# Patient Record
Sex: Female | Born: 1950 | Race: Black or African American | Hispanic: No | Marital: Single | Smoking: Never smoker
Health system: Southern US, Community
[De-identification: ages and names within clinical notes are randomized; demographics above are authoritative.]

## PROBLEM LIST (undated history)

## (undated) DIAGNOSIS — E119 Type 2 diabetes mellitus without complications: Secondary | ICD-10-CM

## (undated) DIAGNOSIS — D509 Iron deficiency anemia, unspecified: Secondary | ICD-10-CM

## (undated) HISTORY — PX: ABDOMINAL HYSTERECTOMY: SHX81

---

## 2014-01-31 DIAGNOSIS — I5030 Unspecified diastolic (congestive) heart failure: Secondary | ICD-10-CM

## 2014-01-31 HISTORY — DX: Unspecified diastolic (congestive) heart failure: I50.30

## 2014-11-11 ENCOUNTER — Encounter (HOSPITAL_COMMUNITY): Payer: Self-pay | Admitting: Emergency Medicine

## 2014-11-11 ENCOUNTER — Emergency Department (HOSPITAL_COMMUNITY): Payer: Self-pay

## 2014-11-11 ENCOUNTER — Observation Stay (HOSPITAL_COMMUNITY)
Admission: EM | Admit: 2014-11-11 | Discharge: 2014-11-12 | Disposition: A | Discharge status: Home / Self Care | Payer: Self-pay | Attending: Internal Medicine | Admitting: Internal Medicine

## 2014-11-11 DIAGNOSIS — R9431 Abnormal electrocardiogram [ECG] [EKG]: Secondary | ICD-10-CM | POA: Insufficient documentation

## 2014-11-11 DIAGNOSIS — N179 Acute kidney failure, unspecified: Secondary | ICD-10-CM | POA: Insufficient documentation

## 2014-11-11 DIAGNOSIS — R7989 Other specified abnormal findings of blood chemistry: Secondary | ICD-10-CM

## 2014-11-11 DIAGNOSIS — Y9389 Activity, other specified: Secondary | ICD-10-CM | POA: Insufficient documentation

## 2014-11-11 DIAGNOSIS — Z90710 Acquired absence of both cervix and uterus: Secondary | ICD-10-CM | POA: Insufficient documentation

## 2014-11-11 DIAGNOSIS — Y9289 Other specified places as the place of occurrence of the external cause: Secondary | ICD-10-CM | POA: Insufficient documentation

## 2014-11-11 DIAGNOSIS — N189 Chronic kidney disease, unspecified: Secondary | ICD-10-CM

## 2014-11-11 DIAGNOSIS — D649 Anemia, unspecified: Secondary | ICD-10-CM

## 2014-11-11 DIAGNOSIS — R739 Hyperglycemia, unspecified: Secondary | ICD-10-CM | POA: Insufficient documentation

## 2014-11-11 DIAGNOSIS — Y998 Other external cause status: Secondary | ICD-10-CM | POA: Insufficient documentation

## 2014-11-11 DIAGNOSIS — R55 Syncope and collapse: Principal | ICD-10-CM | POA: Insufficient documentation

## 2014-11-11 DIAGNOSIS — W19XXXA Unspecified fall, initial encounter: Secondary | ICD-10-CM | POA: Insufficient documentation

## 2014-11-11 DIAGNOSIS — M25561 Pain in right knee: Secondary | ICD-10-CM | POA: Insufficient documentation

## 2014-11-11 LAB — BASIC METABOLIC PANEL
Anion gap: 9 (ref 5–15)
BUN: 20 mg/dL (ref 6–20)
CHLORIDE: 106 mmol/L (ref 101–111)
CO2: 23 mmol/L (ref 22–32)
Calcium: 9.4 mg/dL (ref 8.9–10.3)
Creatinine, Ser: 1.08 mg/dL — ABNORMAL HIGH (ref 0.44–1.00)
GFR calc Af Amer: >60 mL/min (ref >60)
GFR calc non Af Amer: 53 mL/min — ABNORMAL LOW (ref >60)
Glucose, Bld: 286 mg/dL — ABNORMAL HIGH (ref 65–99)
POTASSIUM: 4.2 mmol/L (ref 3.5–5.1)
SODIUM: 138 mmol/L (ref 135–145)

## 2014-11-11 LAB — I-STAT TROPONIN, ED: Troponin i, poc: 0.01 ng/mL (ref 0.00–0.08)

## 2014-11-11 LAB — CBC
HEMATOCRIT: 34.7 % — AB (ref 36.0–46.0)
Hemoglobin: 11.7 g/dL — ABNORMAL LOW (ref 12.0–15.0)
MCH: 28.8 pg (ref 26.0–34.0)
MCHC: 33.7 g/dL (ref 30.0–36.0)
MCV: 85.5 fL (ref 78.0–100.0)
Platelets: 264 10*3/uL (ref 150–400)
RBC: 4.06 MIL/uL (ref 3.87–5.11)
RDW: 13.4 % (ref 11.5–15.5)
WBC: 7.9 10*3/uL (ref 4.0–10.5)

## 2014-11-11 LAB — MAGNESIUM: MAGNESIUM: 2.1 mg/dL (ref 1.7–2.4)

## 2014-11-11 MED ORDER — ACETAMINOPHEN 500 MG PO TABS
1000.0000 mg | ORAL_TABLET | Freq: Once | ORAL | Status: AC
  Administered 2014-11-11: 1000 mg via ORAL
  Filled 2014-11-11: qty 2

## 2014-11-11 MED ORDER — SODIUM CHLORIDE 0.9 % IV BOLUS (SEPSIS)
500.0000 mL | Freq: Once | INTRAVENOUS | Status: AC
  Administered 2014-11-11: 500 mL via INTRAVENOUS

## 2014-11-11 MED ORDER — BACITRACIN ZINC 500 UNIT/GM EX OINT
TOPICAL_OINTMENT | Freq: Once | CUTANEOUS | Status: AC
  Administered 2014-11-11: 1 via TOPICAL
  Filled 2014-11-11: qty 0.9

## 2014-11-11 NOTE — ED Notes (Signed)
Pt presents via EMS for syncopal episode. Denies head injury, no anticoagulants. A&O x4.   20g left wrist, 250cc NS in route.    Last VS:112/64, cbg WNL

## 2014-11-11 NOTE — ED Provider Notes (Signed)
CSN: [id number]     Arrival date & time 11/11/14  1845 History   First MD Initiated Contact with Patient 11/11/14 1917     Chief Complaint  Patient presents with  . Near Syncope     (Consider location/radiation/quality/duration/timing/severity/associated sxs/prior Treatment) HPI   Blood pressure 107/80, pulse 92, temperature 97.7 F (36.5 C), temperature source Oral, resp. rate 18, SpO2 100 %.  Ruth Reynolds is a 64 y.o. female complaining of syncopal episodes while waiting for the bus. Patient states she was transferring, standing up and she felt lightheaded, she fell on a bystander helped her up, she fell a second time. She does not believe there was any head trauma. Patient states that she scraped her knees in the process. She denies prodrome of chest pain, shortness of breath. Of note, Patient has been on the fast for the last several days and eating only apples and water with lemon, states that she's had a lot of water. Patient states that she had a good meal today however. Denies nausea, vomiting, melena, hematochezia, abdominal pain, chest pain, history of DVT or PE, calf pain, leg swelling, recent immobilizations.    No primary care  History reviewed. No pertinent past medical history. Past Surgical History  Procedure Laterality Date  . Abdominal hysterectomy     History reviewed. No pertinent family history. Social History  Substance Use Topics  . Smoking status: Never Smoker   . Smokeless tobacco: None  . Alcohol Use: Yes   OB History    No data available     Review of Systems  10 systems reviewed and found to be negative, except as noted in the HPI.   Allergies  Review of patient's allergies indicates no known allergies.  Home Medications   Prior to Admission medications   Medication Sig Start Date End Date Taking? Authorizing Provider  Ascorbic Acid (VITAMIN C) 100 MG tablet Take 100 mg by mouth daily.   Yes Historical Provider, MD  Multiple  Vitamins-Minerals (MULTIVITAMIN & MINERAL PO) Take 1 tablet by mouth daily.   Yes Historical Provider, MD  vitamin E 100 UNIT capsule Take 100 Units by mouth daily.   Yes Historical Provider, MD   BP 126/71 mmHg  Pulse 86  Temp(Src) 97.7 F (36.5 C) (Oral)  Resp 16  SpO2 100% Physical Exam  Constitutional: She is oriented to person, place, and time. She appears well-developed and well-nourished. No distress.  HENT:  Head: Normocephalic and atraumatic.  Mouth/Throat: Oropharynx is clear and moist.  Area of alopecia to the right temple which patient states she's had for one year.  No abrasions or contusions.   No hemotympanum, battle signs or raccoon's eyes  No crepitance or tenderness to palpation along the orbital rim.  EOMI intact with no pain or diplopia  No abnormal otorrhea or rhinorrhea. Nasal septum midline.  No intraoral trauma.      Eyes: Conjunctivae and EOM are normal. Pupils are equal, round, and reactive to light.  Neck: Normal range of motion.  No midline C-spine  tenderness to palpation or step-offs appreciated. Patient has full range of motion without pain.  Grip strength, biceps, triceps 5/5 bilaterally;  can differentiate between pinprick and light touch bilaterally.   Cardiovascular: Normal rate, regular rhythm and intact distal pulses.   Pulmonary/Chest: Effort normal and breath sounds normal. No stridor. No respiratory distress. She has no wheezes. She has no rales. She exhibits no tenderness.  Abdominal: Soft. Bowel sounds are normal. She exhibits no distension  and no mass. There is no tenderness. There is no rebound and no guarding.  Musculoskeletal: Normal range of motion. She exhibits tenderness.  Right knee:  Abrasion to right knee. No deformity FROM. No effusion or crepitance. Anterior and posterior drawer show no abnormal laxity. Stable to valgus and varus stress. Joint lines are non-tender. Neurovascularly intact.  Neurological: She is alert and  oriented to person, place, and time.  Skin: Skin is warm.  Psychiatric: She has a normal mood and affect.  Nursing note and vitals reviewed.   ED Course  Procedures (including critical care time) Labs Review Labs Reviewed  CBC - Abnormal; Notable for the following:    Hemoglobin 11.7 (*)    HCT 34.7 (*)    All other components within normal limits  BASIC METABOLIC PANEL - Abnormal; Notable for the following:    Glucose, Bld 286 (*)    Creatinine, Ser 1.08 (*)    GFR calc non Af Amer 53 (*)    All other components within normal limits  MAGNESIUM  URINALYSIS, ROUTINE W REFLEX MICROSCOPIC (NOT AT [facility])  Rosezena Sensor, ED    Imaging Review Dg Knee Complete 4 Views Right  11/11/2014   CLINICAL DATA:  64 year old female who fell onto right knee today with anterior pain. Initial encounter.  EXAM: RIGHT KNEE - COMPLETE 4+ VIEW  COMPARISON:  None.  FINDINGS: Patella appears intact. No joint effusion identified. Joint spaces preserved with mild tricompartmental degenerative spurring. No acute osseous abnormality identified.  IMPRESSION: No acute fracture or dislocation identified about the right knee.   Electronically Signed   By: Odessa Fleming M.D.   On: 11/11/2014 20:17   I have personally reviewed and evaluated these images and lab results as part of my medical decision-making.   EKG Interpretation   Date/Time:  Tuesday November 11 2014 19:52:52 EDT Ventricular Rate:  103 PR Interval:  180 QRS Duration: 95 QT Interval:  385 QTC Calculation: 504 R Axis:   -21 Text Interpretation:  Sinus tachycardia Borderline left axis deviation  Nonspecific T abnormalities, lateral leads Prolonged QT interval No prior  EKG for comparison Confirmed by LIU MD, DANA [phone]) on 11/11/2014 9:18:49  PM      MDM   Final diagnoses:  Hyperglycemia without ketosis  Syncope and collapse  Prolonged Q-T interval on ECG  Elevated serum creatinine    Filed Vitals:   11/11/14 1920 11/11/14 2119  BP:  107/80 126/71  Pulse: 92 86  Temp: 97.7 F (36.5 C)   TempSrc: Oral   Resp: 18 16  SpO2: 100% 100%    Medications  acetaminophen (TYLENOL) tablet 1,000 mg (1,000 mg Oral Given 11/11/14 2253)  bacitracin ointment (1 application Topical Given 11/11/14 2255)  sodium chloride 0.9 % bolus 500 mL (500 mLs Intravenous New Bag/Given 11/11/14 2255)    Flo Kayes is a pleasant 64 y.o. female presenting syncopal episodes while patient was waiting for a bus. Patient states that she has been fasting for the last several days, she has been pushing fluids however and had a normal meal today. EKG shows prolonged QT C of 504. Normal potassium and magnesium. Blood work shows hyperglycemia at 286 with acute kidney injury at 1.08. Borderline orthostatic vital signs. Considering this patient's syncopal event and prolonged QT will need observation admission. Case discussed with Dr. Park Breed who will put him holding orders. Small abrasion to right knee, x-ray negative. Patient declines tetanus shot.  This is a shared visit with the attending physician who  personally evaluated the patient and agrees with the care plan.      Wynetta Emery, PA-C 11/11/14 2305  Wynetta Emery, PA-C 11/11/14 1610  Lavera Guise, MD 11/12/14 1213

## 2014-11-11 NOTE — H&P (Signed)
[facility] History and Physical  Shaneka Beechy NWG:[id number] DOB: 1950/07/28 DOA: 11/11/2014  Referring physician: Wynetta Emery, PA-C PCP: No primary care provider on file.   Chief Complaint: Syncope  HPI: Ruth Reynolds is a 64 y.o. female with no significant history and no PCP presents with a syncopal episode. Patient was apparently waiting for the bus when she stood up she felt light headed and fell on a bystander. Patient tried to get up she again fell. She states she may have lost consciousness. She states that she did not hit anything. She states that she fell to her knees. She did not feel weak prior to falling down Patient did not have any chest pain noted. She denies having headache she did not have any shortness of breath. No nausea vomiting no seizuresShe has been on some type of diet? Of apples and lemons along with water. She states that she had been fasting for 2 days prior. She had a meal today while she was at [employer]. She denies smoking or drinking. No drug use. No is currently on no medications    Review of Systems:  Complete 12 point ROS unremarkable other than HPI  CHistory reviewed. No pertinent past medical history. Past Surgical History  Procedure Laterality Date  . Abdominal hysterectomy     Social History:  reports that she has never smoked. She does not have any smokeless tobacco history on file. She reports that she drinks alcohol. She reports that she does not use illicit drugs.  No Known Allergies  History reviewed. No pertinent family history.   Prior to Admission medications   Medication Sig Start Date End Date Taking? Authorizing Provider  Ascorbic Acid (VITAMIN C) 100 MG tablet Take 100 mg by mouth daily.   Yes Historical Provider, MD  Multiple Vitamins-Minerals (MULTIVITAMIN & MINERAL PO) Take 1 tablet by mouth daily.   Yes Historical Provider, MD  vitamin E 100 UNIT capsule Take 100 Units by mouth daily.   Yes Historical Provider, MD    Physical Exam: Filed Vitals:   11/11/14 1920 11/11/14 2119  BP: 107/80 126/71  Pulse: 92 86  Temp: 97.7 F (36.5 C)   TempSrc: Oral   Resp: 18 16  SpO2: 100% 100%    Wt Readings from Last 3 Encounters:  No data found for Wt    General:  Appears calm and comfortable Eyes: PERRL, normal lids, irises & conjunctiva ENT: grossly normal hearing, lips & tongue Neck: no LAD, masses or thyromegaly Cardiovascular: RRR, no m/r/g. No LE edema. Telemetry: SR, no arrhythmias  Respiratory: CTA bilaterally, no w/r/r. Normal respiratory effort. Abdomen: soft, ntnd Skin: no rash or induration seen on limited exam Musculoskeletal: grossly normal tone BUE/BLE Psychiatric: grossly normal mood and affect Neurologic: grossly non-focal.          Labs on Admission:  Basic Metabolic Panel:  Recent Labs Lab 11/11/14 2140  NA 138  K 4.2  CL 106  CO2 23  GLUCOSE 286*  BUN 20  CREATININE 1.08*  CALCIUM 9.4  MG 2.1   Liver Function Tests: No results for input(s): AST, ALT, ALKPHOS, BILITOT, PROT, ALBUMIN in the last 168 hours. No results for input(s): LIPASE, AMYLASE in the last 168 hours. No results for input(s): AMMONIA in the last 168 hours. CBC:  Recent Labs Lab 11/11/14 2030  WBC 7.9  HGB 11.7*  HCT 34.7*  MCV 85.5  PLT 264   Cardiac Enzymes: No results for input(s): CKTOTAL, CKMB, CKMBINDEX, TROPONINI in the  last 168 hours.  BNP (last 3 results) No results for input(s): BNP in the last 8760 hours.  ProBNP (last 3 results) No results for input(s): PROBNP in the last 8760 hours.  CBG: No results for input(s): GLUCAP in the last 168 hours.  Radiological Exams on Admission: Dg Knee Complete 4 Views Right  11/11/2014   CLINICAL DATA:  64 year old female who fell onto right knee today with anterior pain. Initial encounter.  EXAM: RIGHT KNEE - COMPLETE 4+ VIEW  COMPARISON:  None.  FINDINGS: Patella appears intact. No joint effusion identified. Joint spaces preserved  with mild tricompartmental degenerative spurring. No acute osseous abnormality identified.  IMPRESSION: No acute fracture or dislocation identified about the right knee.   Electronically Signed   By: Odessa Fleming M.D.   On: 11/11/2014 20:17    EKG: Independently reviewed.prolonged QT  Assessment/Plan Active Problems:   Syncope   CKD (chronic kidney disease)   Anemia   1. Syncope -will be admitted to telemetry observation -will get an echo -will order a carotid doppler -will also check serial enzymes  2. CKD? -will start on IVF -repeat labs in am  3. Anemia -will check iron studies     Code Status: full code (must indicate code status--if unknown or must be presumed, indicate so) DVT Prophylaxis:heparin Family Communication: none (indicate person spoken with, if applicable, with phone number if by telephone) Disposition Plan: home (indicate anticipated LOS)    [facility] Pager [phone]

## 2014-11-11 NOTE — ED Notes (Signed)
Bed: [facility] Expected date:  Expected time:  Means of arrival:  Comments: 

## 2014-11-11 NOTE — ED Notes (Signed)
UNABLE TO COLLECT LABS AT THIS TIME PATIENT IS IN XRAY 

## 2014-11-11 NOTE — Progress Notes (Addendum)
EDCM spoke to patient at bedside. Patient confirms she does not have a pcp or insurance living in [place].  [facility] provide patient with contact information to [facility], informed patient of services there.  EDCM also provided patient with list of pcps who accept self pay patients, list of discount pharmacies and websites needymeds.org and GoodRX.com for medication assistance, phone number to inquire about the orange card, phone number to inquire about Mediciad, phone number to inquire about the Affordable Care Act, financial resources in the community such as local churches, [employer], urban ministries, and dental assistance for uninsured patients.  Patient thankful for resources.  No further EDCM needs at this time.  Patient agreeable to have [facility] place referral for orange card.  [facility] referral placed for orange card.

## 2014-11-11 NOTE — ED Notes (Signed)
Unable to draw labs from I.V.

## 2014-11-12 ENCOUNTER — Observation Stay (HOSPITAL_BASED_OUTPATIENT_CLINIC_OR_DEPARTMENT_OTHER): Payer: Self-pay

## 2014-11-12 DIAGNOSIS — R55 Syncope and collapse: Secondary | ICD-10-CM

## 2014-11-12 DIAGNOSIS — N179 Acute kidney failure, unspecified: Secondary | ICD-10-CM

## 2014-11-12 LAB — CBC
HEMATOCRIT: 27.9 % — AB (ref 36.0–46.0)
HEMATOCRIT: 32 % — AB (ref 36.0–46.0)
HEMOGLOBIN: 9.5 g/dL — AB (ref 12.0–15.0)
Hemoglobin: 10.8 g/dL — ABNORMAL LOW (ref 12.0–15.0)
MCH: 29 pg (ref 26.0–34.0)
MCH: 29.1 pg (ref 26.0–34.0)
MCHC: 33.8 g/dL (ref 30.0–36.0)
MCHC: 34.1 g/dL (ref 30.0–36.0)
MCV: 85.6 fL (ref 78.0–100.0)
MCV: 86 fL (ref 78.0–100.0)
Platelets: 278 10*3/uL (ref 150–400)
Platelets: 253 10*3/uL (ref 150–400)
RBC: 3.26 MIL/uL — AB (ref 3.87–5.11)
RBC: 3.72 MIL/uL — AB (ref 3.87–5.11)
RDW: 13.3 % (ref 11.5–15.5)
RDW: 13.5 % (ref 11.5–15.5)
WBC: 4.1 10*3/uL (ref 4.0–10.5)
WBC: 5.5 10*3/uL (ref 4.0–10.5)

## 2014-11-12 LAB — COMPREHENSIVE METABOLIC PANEL
ALBUMIN: 3.6 g/dL (ref 3.5–5.0)
ALK PHOS: 71 U/L (ref 38–126)
ALT: 13 U/L — AB (ref 14–54)
AST: 15 U/L (ref 15–41)
Anion gap: 7 (ref 5–15)
BILIRUBIN TOTAL: 0.6 mg/dL (ref 0.3–1.2)
BUN: 21 mg/dL — AB (ref 6–20)
CALCIUM: 8.8 mg/dL — AB (ref 8.9–10.3)
CO2: 24 mmol/L (ref 22–32)
CREATININE: 0.94 mg/dL (ref 0.44–1.00)
Chloride: 108 mmol/L (ref 101–111)
GFR calc Af Amer: >60 mL/min (ref >60)
GLUCOSE: 160 mg/dL — AB (ref 65–99)
POTASSIUM: 3.6 mmol/L (ref 3.5–5.1)
Sodium: 139 mmol/L (ref 135–145)
TOTAL PROTEIN: 7.4 g/dL (ref 6.5–8.1)

## 2014-11-12 LAB — VITAMIN B12: Vitamin B-12: 324 pg/mL (ref 180–914)

## 2014-11-12 LAB — CREATININE, SERUM
CREATININE: 1.09 mg/dL — AB (ref 0.44–1.00)
GFR calc Af Amer: >60 mL/min (ref >60)
GFR calc non Af Amer: 52 mL/min — ABNORMAL LOW (ref >60)

## 2014-11-12 LAB — IRON AND TIBC
IRON: 31 ug/dL (ref 28–170)
Saturation Ratios: 10 % — ABNORMAL LOW (ref 10.4–31.8)
TIBC: 322 ug/dL (ref 250–450)
UIBC: 291 ug/dL

## 2014-11-12 LAB — TSH: TSH: 0.55 u[IU]/mL (ref 0.350–4.500)

## 2014-11-12 LAB — TROPONIN I: Troponin I: <0.03 ng/mL (ref <0.031)

## 2014-11-12 LAB — FERRITIN: FERRITIN: 184 ng/mL (ref 11–307)

## 2014-11-12 LAB — GLUCOSE, CAPILLARY: GLUCOSE-CAPILLARY: 153 mg/dL — AB (ref 65–99)

## 2014-11-12 MED ORDER — SODIUM CHLORIDE 0.9 % IJ SOLN: 3.0000 mL | Freq: Two times a day (BID) | INTRAMUSCULAR | Status: DC

## 2014-11-12 MED ORDER — ACETAMINOPHEN 650 MG RE SUPP: 650.0000 mg | Freq: Four times a day (QID) | RECTAL | Status: DC | PRN

## 2014-11-12 MED ORDER — SODIUM CHLORIDE 0.9 % IV SOLN
INTRAVENOUS | Status: DC
  Administered 2014-11-12: via INTRAVENOUS

## 2014-11-12 MED ORDER — ONDANSETRON HCL 4 MG/2ML IJ SOLN: 4.0000 mg | Freq: Four times a day (QID) | INTRAMUSCULAR | Status: DC | PRN

## 2014-11-12 MED ORDER — ONDANSETRON HCL 4 MG PO TABS: 4.0000 mg | ORAL_TABLET | Freq: Four times a day (QID) | ORAL | Status: DC | PRN

## 2014-11-12 MED ORDER — HEPARIN SODIUM (PORCINE) 5000 UNIT/ML IJ SOLN: 5000.0000 [IU] | Freq: Three times a day (TID) | INTRAMUSCULAR | Status: DC

## 2014-11-12 MED ORDER — ACETAMINOPHEN 325 MG PO TABS
650.0000 mg | ORAL_TABLET | Freq: Four times a day (QID) | ORAL | Status: DC | PRN
  Administered 2014-11-12: 650 mg via ORAL
  Filled 2014-11-12: qty 2

## 2014-11-12 NOTE — Progress Notes (Signed)
*  PRELIMINARY RESULTS* Echocardiogram 2D Echocardiogram has been performed.  Dorothey BasemanReel, Violia Knopf M 11/12/2014, 12:48 PM

## 2014-11-12 NOTE — Progress Notes (Signed)
*  PRELIMINARY RESULTS* Vascular Ultrasound Carotid Duplex (Doppler) has been completed.  Preliminary findings: Bilateral: No significant (1-39%) ICA stenosis. Antegrade vertebral flow.    Farrel DemarkJill Eunice, RDMS, RVT  11/12/2014, 9:09 AM

## 2014-11-12 NOTE — Discharge Instructions (Signed)

## 2014-11-12 NOTE — Care Management Note (Signed)
Case Management Note  Patient Details  Name: Ruth Reynolds MRN: [id number] Date of Birth: 01/22/1951  Subjective/Objective:  Syncope                  Action/Plan: from home   Expected Discharge Date:                  Expected Discharge Plan:  Home/Self Care  In-House Referral:     Discharge planning Services  CM Consult, Follow-up appt scheduled  Post Acute Care Choice:    Choice offered to:     DME Arranged:    DME Agency:     HH Arranged:    HH Agency:     Status of Service:  Completed, signed off  Medicare Important Message Given:    Date Medicare IM Given:    Medicare IM give by:    Date Additional Medicare IM Given:    Additional Medicare Important Message give by:     If discussed at Long Length of Stay Meetings, dates discussed:    Additional Comments: [facility] 11/18/14 at 3PM, pt encouraged to keep this appointment.   Geni BersMcGibboney, Marvelyn Bouchillon, RN 11/12/2014, 12:35 PM

## 2014-11-12 NOTE — Discharge Summary (Signed)
Physician Discharge Summary  Ruth Reynolds ZOX:[id number] DOB: [birth year] DOA: 11/11/2014  PCP: No primary care provider on file.will set up with [facility] on discharge   Admit date: 11/11/2014 Discharge date: 11/12/2014  Recommendations for Outpatient Follow-up:  1. No new medications on discharge   Discharge Diagnoses:  Active Problems:   Syncope   CKD (chronic kidney disease)   Anemia    Discharge Condition: stable   Diet recommendation: as tolerated   History of present illness:  64 y.o. female with no significant history and no PCP who presented to [facility] ED after a syncopal event while doing grocery shopping on the day prior to this admission. Pt reported she fell as her knees gave out but she never hit her head. Blood work was essentially unremarkable except for slightly elevated Cr at 1.08.  Hospital Course:   Active Problems:   Syncope - Likely vasovagal - 2 D ECHO pending - Caroid doppler WNL - PT evaluation obtained    Acute renal failure - Likely prerenal, possible dehydration - Resolved with IV fluids     Signed:  Manson Passey, MD  [facility] 11/12/2014, 10:07 AM  Pager #: [phone]  Time spent in minutes: more than 30 minutes  Procedures:  2 D ECHO  Carotid doppler   Consultations:  PT  Discharge Exam: Filed Vitals:   11/12/14 0540  BP: 124/63  Pulse: 68  Temp: 98.1 F (36.7 C)  Resp: 15   Filed Vitals:   11/11/14 2300 11/11/14 2329 11/12/14 0010 11/12/14 0540  BP: 148/107 135/77  124/63  Pulse: 94 72  68  Temp:   98.2 F (36.8 C) 98.1 F (36.7 C)  TempSrc:   Oral Oral  Resp: Height:    (1.676 m)   Weight:   71.4 kg (157 lb 6.5 oz) 73.7 kg (162 lb 7.7 oz)  SpO2: 100% 100% 100% 98%    General: Pt is alert, follows commands appropriately, not in acute distress Cardiovascular: Regular rate and rhythm, S1/S2 +, no murmurs Respiratory: Clear to auscultation bilaterally, no wheezing, no crackles, no  rhonchi Abdominal: Soft, non tender, non distended, bowel sounds +, no guarding Extremities: no edema, no cyanosis, pulses palpable bilaterally DP and PT Neuro: Grossly nonfocal  Discharge Instructions  Discharge Instructions    Call MD for:  difficulty breathing, headache or visual disturbances    Complete by:  As directed      Call MD for:  persistant dizziness or light-headedness    Complete by:  As directed      Call MD for:  persistant nausea and vomiting    Complete by:  As directed      Call MD for:  severe uncontrolled pain    Complete by:  As directed      Diet - low sodium heart healthy    Complete by:  As directed      Increase activity slowly    Complete by:  As directed             Medication List    TAKE these medications        MULTIVITAMIN & MINERAL PO  Take 1 tablet by mouth daily.     vitamin C 100 MG tablet  Take 100 mg by mouth daily.     vitamin E 100 UNIT capsule  Take 100 Units by mouth daily.          The results of significant diagnostics from this hospitalization (  including imaging, microbiology, ancillary and laboratory) are listed below for reference.    Significant Diagnostic Studies: Dg Knee Complete 4 Views Right  11/11/2014  CLINICAL DATA:  64 year old female who fell onto right knee today with anterior pain. Initial encounter. EXAM: RIGHT KNEE - COMPLETE 4+ VIEW COMPARISON:  None. FINDINGS: Patella appears intact. No joint effusion identified. Joint spaces preserved with mild tricompartmental degenerative spurring. No acute osseous abnormality identified. IMPRESSION: No acute fracture or dislocation identified about the right knee. Electronically Signed   By: Odessa FlemingH  Hall M.D.   On: 11/11/2014 20:17    Microbiology: No results found for this or any previous visit (from the past 240 hour(s)).   Labs: Basic Metabolic Panel:  Recent Labs Lab 11/11/14 2140 11/12/14 0040 11/12/14 0710  NA 138  --  139  K 4.2  --  3.6  CL 106  --   108  CO2 23  --  24  GLUCOSE 286*  --  160*  BUN 20  --  21*  CREATININE 1.08* 1.09* 0.94  CALCIUM 9.4  --  8.8*  MG 2.1  --   --    Liver Function Tests:  Recent Labs Lab 11/12/14 0710  AST 15  ALT 13*  ALKPHOS 71  BILITOT 0.6  PROT 7.4  ALBUMIN 3.6   No results for input(s): LIPASE, AMYLASE in the last 168 hours. No results for input(s): AMMONIA in the last 168 hours. CBC:  Recent Labs Lab 11/11/14 2030 11/12/14 0040 11/12/14 0710  WBC 7.9 5.5 4.1  HGB 11.7* 10.8* 9.5*  HCT 34.7* 32.0* 27.9*  MCV 85.5 86.0 85.6  PLT 264 278 253   Cardiac Enzymes:  Recent Labs Lab 11/12/14 0040 11/12/14 0710  TROPONINI <0.03 <0.03   BNP: BNP (last 3 results) No results for input(s): BNP in the last 8760 hours.  ProBNP (last 3 results) No results for input(s): PROBNP in the last 8760 hours.  CBG:  Recent Labs Lab 11/12/14 0757  GLUCAP 153*

## 2014-11-12 NOTE — Evaluation (Signed)
Physical Therapy One Time Evaluation Patient Details Name: Ruth Reynolds MRN: [id number] DOB: [birth year] Today's Date: 11/12/2014   History of Present Illness  64 y.o. female complaining of syncopal episode while waiting for the bus. She does not believe there was any head trauma however states that she scraped her knees in the process.  Clinical Impression  Patient evaluated by Physical Therapy with no further acute PT needs identified. All education has been completed and the patient has no further questions.  Pt reports syncope episode and abrasions to knees prior to admission.  Pt reports fasting for a few days prior to admission as well (possibly contributing to syncope episode) and requesting to speak to dietician (notified RN). Pt did not have any dizziness with mobility during session.  See below for any follow-up Physical Therapy or equipment needs. PT is signing off. Thank you for this referral.   Follow Up Recommendations No PT follow up    Equipment Recommendations  None recommended by PT    Recommendations for Other Services       Precautions / Restrictions Precautions Precautions: Fall      Mobility  Bed Mobility Overal bed mobility: Modified Independent                Transfers Overall transfer level: Modified independent                  Ambulation/Gait Ambulation/Gait assistance: Supervision;Modified independent (Device/Increase time) Ambulation Distance (Feet): 200 Feet Assistive device: None Gait Pattern/deviations: Step-through pattern;Decreased stride length     General Gait Details: reports sore knees requiring 3 standing short rest breaks, no LOB or unsteadiness observed, pt declines need for assistive device, denies dizziness  Stairs            Wheelchair Mobility    Modified Rankin (Stroke Patients Only)       Balance Overall balance assessment: No apparent balance deficits (not formally assessed) (syncope episode with fall  prior to admission)                                           Pertinent Vitals/Pain Pain Assessment: Faces Faces Pain Scale: Hurts little more Pain Location: knees Pain Descriptors / Indicators: Sore Pain Intervention(s): Monitored during session    Home Living Family/patient expects to be discharged to:: Private residence Living Arrangements: Alone           Home Layout: Two level Home Equipment: None      Prior Function Level of Independence: Independent               Hand Dominance        Extremity/Trunk Assessment   Upper Extremity Assessment: Overall WFL for tasks assessed           Lower Extremity Assessment: Overall WFL for tasks assessed      Cervical / Trunk Assessment: Normal  Communication   Communication: No difficulties  Cognition Arousal/Alertness: Awake/alert Behavior During Therapy: WFL for tasks assessed/performed Overall Cognitive Status: Within Functional Limits for tasks assessed                      General Comments      Exercises        Assessment/Plan    PT Assessment Patent does not need any further PT services  PT Diagnosis     PT Problem List  PT Treatment Interventions     PT Goals (Current goals can be found in the Care Plan section) Acute Rehab PT Goals PT Goal Formulation: All assessment and education complete, DC therapy    Frequency     Barriers to discharge        Co-evaluation               End of Session Equipment Utilized During Treatment: Gait belt Activity Tolerance: Patient tolerated treatment well Patient left: in chair;with call bell/phone within reach Nurse Communication: Mobility status    Functional Assessment Tool Used: clinical judgement Functional Limitation: Mobility: Walking and moving around Mobility: Walking and Moving Around Current Status [phone](G8978): 0 percent impaired, limited or restricted Mobility: Walking and Moving Around Goal Status  [phone](G8979): 0 percent impaired, limited or restricted Mobility: Walking and Moving Around Discharge Status [phone](G8980): 0 percent impaired, limited or restricted    Time: [id number] PT Time Calculation (min) (ACUTE ONLY): 11 min   Charges:   PT Evaluation $Initial PT Evaluation Tier I: 1 Procedure     PT G Codes:   PT G-Codes **NOT FOR INPATIENT CLASS** Functional Assessment Tool Used: clinical judgement Functional Limitation: Mobility: Walking and moving around Mobility: Walking and Moving Around Current Status ([phone](G8978): 0 percent impaired, limited or restricted Mobility: Walking and Moving Around Goal Status ([phone](G8979): 0 percent impaired, limited or restricted Mobility: Walking and Moving Around Discharge Status ([phone](G8980): 0 percent impaired, limited or restricted    Chesnie Capell,KATHrine E 11/12/2014, 3:25 PM Zenovia JarredKati Taaj Hurlbut, PT, DPT 11/12/2014 Pager: [phone]

## 2014-11-13 LAB — HEMOGLOBIN A1C
HEMOGLOBIN A1C: 9.9 % — AB (ref 4.8–5.6)
MEAN PLASMA GLUCOSE: 237 mg/dL

## 2014-11-13 LAB — FOLATE RBC
FOLATE, RBC: 924 ng/mL (ref >498)
Folate, Hemolysate: 312.2 ng/mL
Hematocrit: 33.8 % — ABNORMAL LOW (ref 34.0–46.6)

## 2014-11-18 ENCOUNTER — Inpatient Hospital Stay: Payer: Self-pay | Admitting: Family Medicine

## 2014-11-26 ENCOUNTER — Ambulatory Visit: Payer: Self-pay | Attending: Family Medicine | Admitting: Family Medicine

## 2014-11-26 ENCOUNTER — Encounter: Payer: Self-pay | Admitting: Family Medicine

## 2014-11-26 ENCOUNTER — Ambulatory Visit (HOSPITAL_BASED_OUTPATIENT_CLINIC_OR_DEPARTMENT_OTHER): Payer: Self-pay | Admitting: Pharmacist

## 2014-11-26 VITALS — BP 132/77 | HR 102 | Temp 98.8°F | Resp 16 | Ht 66.0 in | Wt 168.0 lb

## 2014-11-26 DIAGNOSIS — E119 Type 2 diabetes mellitus without complications: Secondary | ICD-10-CM | POA: Insufficient documentation

## 2014-11-26 DIAGNOSIS — E118 Type 2 diabetes mellitus with unspecified complications: Secondary | ICD-10-CM

## 2014-11-26 DIAGNOSIS — R55 Syncope and collapse: Secondary | ICD-10-CM | POA: Insufficient documentation

## 2014-11-26 LAB — GLUCOSE, POCT (MANUAL RESULT ENTRY): POC Glucose: 247 mg/dl — AB (ref 70–99)

## 2014-11-26 MED ORDER — GLUCOSE BLOOD VI STRP: ORAL_STRIP | Status: DC

## 2014-11-26 MED ORDER — TRUE METRIX METER DEVI: 1.0000 | Freq: Three times a day (TID) | Status: DC

## 2014-11-26 MED ORDER — METFORMIN HCL 500 MG PO TABS: 500.0000 mg | ORAL_TABLET | Freq: Two times a day (BID) | ORAL | Status: DC

## 2014-11-26 MED ORDER — TRUEPLUS LANCETS 28G MISC: 1.0000 | Freq: Two times a day (BID) | Status: DC

## 2014-11-26 NOTE — Progress Notes (Signed)
Pt's here for f/up syncope x 37mo ago. Pt reports feeling good, with no pain.

## 2014-11-26 NOTE — Patient Instructions (Signed)
Diabetes Mellitus and Food It is important for you to manage your blood sugar (glucose) level. Your blood glucose level can be greatly affected by what you eat. Eating healthier foods in the appropriate amounts throughout the day at about the same time each day will help you control your blood glucose level. It can also help slow or prevent worsening of your diabetes mellitus. Healthy eating may even help you improve the level of your blood pressure and reach or maintain a healthy weight.  General recommendations for healthful eating and cooking habits include:  Eating meals and snacks regularly. Avoid going long periods of time without eating to lose weight.  Eating a diet that consists mainly of plant-based foods, such as fruits, vegetables, nuts, legumes, and whole grains.  Using low-heat cooking methods, such as baking, instead of high-heat cooking methods, such as deep frying. Work with your dietitian to make sure you understand how to use the Nutrition Facts information on food labels. HOW CAN FOOD AFFECT ME? Carbohydrates Carbohydrates affect your blood glucose level more than any other type of food. Your dietitian will help you determine how many carbohydrates to eat at each meal and teach you how to count carbohydrates. Counting carbohydrates is important to keep your blood glucose at a healthy level, especially if you are using insulin or taking certain medicines for diabetes mellitus. Alcohol Alcohol can cause sudden decreases in blood glucose (hypoglycemia), especially if you use insulin or take certain medicines for diabetes mellitus. Hypoglycemia can be a life-threatening condition. Symptoms of hypoglycemia (sleepiness, dizziness, and disorientation) are similar to symptoms of having too much alcohol.  If your health care provider has given you approval to drink alcohol, do so in moderation and use the following guidelines:  Women should not have more than one drink per day, and men  should not have more than two drinks per day. One drink is equal to:  12 oz of beer.  5 oz of wine.  1 oz of hard liquor.  Do not drink on an empty stomach.  Keep yourself hydrated. Have water, diet soda, or unsweetened iced tea.  Regular soda, juice, and other mixers might contain a lot of carbohydrates and should be counted. WHAT FOODS ARE NOT RECOMMENDED? As you make food choices, it is important to remember that all foods are not the same. Some foods have fewer nutrients per serving than other foods, even though they might have the same number of calories or carbohydrates. It is difficult to get your body what it needs when you eat foods with fewer nutrients. Examples of foods that you should avoid that are high in calories and carbohydrates but low in nutrients include:  Trans fats (most processed foods list trans fats on the Nutrition Facts label).  Regular soda.  Juice.  Candy.  Sweets, such as cake, pie, doughnuts, and cookies.  Fried foods. WHAT FOODS CAN I EAT? Eat nutrient-rich foods, which will nourish your body and keep you healthy. The food you should eat also will depend on several factors, including:  The calories you need.  The medicines you take.  Your weight.  Your blood glucose level.  Your blood pressure level.  Your cholesterol level. You should eat a variety of foods, including:  Protein.  Lean cuts of meat.  Proteins low in saturated fats, such as fish, egg whites, and beans. Avoid processed meats.  Fruits and vegetables.  Fruits and vegetables that may help control blood glucose levels, such as apples, mangoes, and   yams.  Dairy products.  Choose fat-free or low-fat dairy products, such as milk, yogurt, and cheese.  Grains, bread, pasta, and rice.  Choose whole grain products, such as multigrain bread, whole oats, and brown rice. These foods may help control blood pressure.  Fats.  Foods containing healthful fats, such as nuts,  avocado, olive oil, canola oil, and fish. DOES EVERYONE WITH DIABETES MELLITUS HAVE THE SAME MEAL PLAN? Because every person with diabetes mellitus is different, there is not one meal plan that works for everyone. It is very important that you meet with a dietitian who will help you create a meal plan that is just right for you.   This information is not intended to replace advice given to you by your health care provider. Make sure you discuss any questions you have with your health care provider.   Document Released: 10/14/2004 Document Revised: 02/07/2014 Document Reviewed: 12/14/2012 Elsevier Interactive Patient Education 2016 Elsevier Inc.  

## 2014-11-26 NOTE — Progress Notes (Signed)
CC: Follow-up from hospitalization from 11/11/14-11/12/14  HPI: Ruth Reynolds is a 64 y.o. female here today for a follow up visit after presentation to [facility] ED on 11/12/14 with syncope which occurred while she was doing grocery shopping.  On presentation to the ED cardiac enzymes were obtained which came back normal and EKG revealed nonspecific T-wave abnormalities, sinus tachycardia, prolonged QTc interval of 504. Blood work revealed mildly elevated creatinine at 1.08 and she was placed on IV fluids. She had a cardiac Doppler which came back normal, 2-D echo revealed EF of 55-60%, grade 2 diastolic dysfunction.  Her condition improved, syncope was thought to be vasovagal and she was subsequently discharged.   Interval history: Review of her labs from hospitalization reveal an A1c of 9.8 which the patient was not aware of as this is a new diagnosis of type 2 diabetes mellitus. When I discussed this with the patient she was really unconcerned but more concerned about the fact that she has been without utilities and needs a letter to take to [employer] for assistance. Patient has No headache, No chest pain, No abdominal pain - No Nausea, No new weakness tingling or numbness, No Cough - SOB; no recent syncope  No Known Allergies History reviewed. No pertinent past medical history. Current Outpatient Prescriptions on File Prior to Visit  Medication Sig Dispense Refill  . Ascorbic Acid (VITAMIN C) 100 MG tablet Take 100 mg by mouth daily.    . Multiple Vitamins-Minerals (MULTIVITAMIN & MINERAL PO) Take 1 tablet by mouth daily.    . vitamin E 100 UNIT capsule Take 100 Units by mouth daily.     No current facility-administered medications on file prior to visit.   Family History  Problem Relation Age of Onset  . Heart disease Father    Social History   Social History  . Marital Status: Single    Spouse Name: N/A  . Number of Children: N/A  . Years of Education: N/A    Occupational History  . Not on file.   Social History Main Topics  . Smoking status: Never Smoker   . Smokeless tobacco: Not on file  . Alcohol Use: Yes     Comment: occasionally  . Drug Use: No  . Sexual Activity: Not on file   Other Topics Concern  . Not on file   Social History Narrative    Review of Systems: Constitutional: Negative for fever, chills, diaphoresis, activity change, appetite change and fatigue. HENT: Negative for ear pain, nosebleeds, congestion, facial swelling, rhinorrhea, neck pain, neck stiffness and ear discharge.  Eyes: Negative for pain, discharge, redness, itching and visual disturbance. Respiratory: Negative for cough, choking, chest tightness, shortness of breath, wheezing and stridor.  Cardiovascular: Negative for chest pain, palpitations and leg swelling. Gastrointestinal: Negative for abdominal distention. Genitourinary: Negative for dysuria, urgency, frequency, hematuria, flank pain, decreased urine volume, difficulty urinating and dyspareunia.  Musculoskeletal: Negative for back pain, joint swelling, arthralgias and gait problem. Neurological: Negative for dizziness, tremors, seizures, syncope, facial asymmetry, speech difficulty, weakness, light-headedness, numbness and headaches.  Hematological: Negative for adenopathy. Does not bruise/bleed easily. Psychiatric/Behavioral: Negative for hallucinations, behavioral problems, confusion, dysphoric mood, decreased concentration and agitation.    Objective:   Filed Vitals:   11/26/14 1403  BP: 132/77  Pulse: 102  Temp: 98.8 F (37.1 C)  Resp: 16     Physical Exam: Constitutional: Patient appears well-developed and well-nourished. No distress. HENT: Normocephalic, atraumatic, External right and left ear normal. Oropharynx is  clear and moist.  Eyes: Conjunctivae and EOM are normal. PERRLA, no scleral icterus. Neck: Normal ROM. Neck supple. No JVD. No tracheal deviation. No  thyromegaly. CVS: RRR, S1/S2 +, no murmurs, no gallops, no carotid bruit.  Pulmonary: Effort and breath sounds normal, no stridor, rhonchi, wheezes, rales.  Abdominal: Soft. BS +,  no distension, tenderness, rebound or guarding.  Musculoskeletal: Normal range of motion. No edema and no tenderness.  Lymphadenopathy: No lymphadenopathy noted, cervical, inguinal or axillary Neuro: Alert. Normal reflexes, muscle tone coordination. No cranial nerve deficit. Skin: Skin is warm and dry. No rash noted. Not diaphoretic. No erythema. No pallor. Psychiatric: Normal mood and affect. Behavior, judgment, thought content normal.  Lab Results  Component Value Date   WBC 4.1 11/12/2014   HGB 9.5* 11/12/2014   HCT 27.9* 11/12/2014   MCV 85.6 11/12/2014   PLT 253 11/12/2014   Lab Results  Component Value Date   CREATININE 0.94 11/12/2014   BUN 21* 11/12/2014   NA 139 11/12/2014   K 3.6 11/12/2014   CL 108 11/12/2014   CO2 24 11/12/2014    Lab Results  Component Value Date   HGBA1C 9.9* 11/12/2014   Lipid Panel  No results found for: CHOL, TRIG, HDL, CHOLHDL, VLDL, LDLCALC     Assessment and plan:  Type 2 diabetes mellitus: New diagnosis with A1c of 9.8. I am commencing metformin and written testing supplies for her. The clinical pharmacist has been called in for diabetic education which the patient is not really receptive to at this time and she tells me this is a real event given she has no utilities and has no interest all her meals. Due to the fact that she is overwhelmed I will discuss initiation of ACE inhibitor, Pneumovax, and a foot exams, annual eye exams at her next office visit.  Syncope: Thought to be vasovagal. No repeat episodes.  Jaclyn ShaggyEnobong, Amao, MD. [facility] [phone] 11/26/2014, 2:17 PM

## 2014-11-26 NOTE — Progress Notes (Signed)
S:    Patient presents for hospital follow up with Dr. Venetia NightAmao.  Patient reports newly diagnosed diabetes.   Patient reported dietary habits: patient tries to watch what she eats but finds it difficult because she does not have electricity so she is unable to cook or prepare her own food.  Patient reported exercise habits: she denies exercising.    Patient reports nocturia.  Patient denies neuropathy. Patient denies visual changes. Patient denies self foot exams (newly diagnosed).    O:  Lab Results  Component Value Date   HGBA1C 9.9* 11/12/2014   A/P: Diabetes newly diagnosed currently uncontrolled based on A1c of 9.9. Control is suboptimal due to dietary indiscretion and sedentary lifestyle. Dr. Venetia NightAmao started the patient on metformin. I provided counseling on the medication and how to take it, side effects, how it works, Catering manageretc. Also educated patient on the use of a blood glucose meter and the importance of monitoring blood glucose at home. Lastly, educated patient on the A1c and what is means.   Patient will need further education for diet, exercise, and review of blood glucose monitoring. However, patient is overwhelmed with information so will defer further education to next visit.   Next A1C anticipated January 2017.    Written patient instructions provided.  Total time in face to face counseling 20 minutes.   Follow up in Pharmacist Clinic Visit in two weeks.

## 2014-12-10 ENCOUNTER — Ambulatory Visit: Payer: Self-pay | Admitting: Family Medicine

## 2014-12-11 ENCOUNTER — Encounter: Payer: Self-pay | Admitting: Pharmacist

## 2014-12-11 ENCOUNTER — Ambulatory Visit: Payer: Self-pay | Admitting: Family Medicine

## 2014-12-30 ENCOUNTER — Encounter: Payer: Self-pay | Admitting: Pharmacist

## 2014-12-30 ENCOUNTER — Ambulatory Visit: Payer: Self-pay | Admitting: Family Medicine

## 2017-01-31 DIAGNOSIS — S065XAA Traumatic subdural hemorrhage with loss of consciousness status unknown, initial encounter: Secondary | ICD-10-CM

## 2017-01-31 DIAGNOSIS — S065X9A Traumatic subdural hemorrhage with loss of consciousness of unspecified duration, initial encounter: Secondary | ICD-10-CM

## 2017-01-31 HISTORY — DX: Traumatic subdural hemorrhage with loss of consciousness status unknown, initial encounter: S06.5XAA

## 2017-01-31 HISTORY — DX: Traumatic subdural hemorrhage with loss of consciousness of unspecified duration, initial encounter: S06.5X9A

## 2019-06-09 ENCOUNTER — Other Ambulatory Visit: Payer: Self-pay

## 2019-06-09 ENCOUNTER — Encounter (HOSPITAL_COMMUNITY): Payer: Self-pay | Admitting: Emergency Medicine

## 2019-06-09 ENCOUNTER — Emergency Department (HOSPITAL_COMMUNITY): Payer: Medicare Other

## 2019-06-09 ENCOUNTER — Emergency Department (HOSPITAL_COMMUNITY)
Admission: EM | Admit: 2019-06-09 | Discharge: 2019-06-09 | Disposition: A | Discharge status: Home / Self Care | Payer: Medicare Other | Attending: Emergency Medicine | Admitting: Emergency Medicine

## 2019-06-09 DIAGNOSIS — S93402A Sprain of unspecified ligament of left ankle, initial encounter: Secondary | ICD-10-CM | POA: Diagnosis not present

## 2019-06-09 DIAGNOSIS — N189 Chronic kidney disease, unspecified: Secondary | ICD-10-CM | POA: Diagnosis not present

## 2019-06-09 DIAGNOSIS — Z79899 Other long term (current) drug therapy: Secondary | ICD-10-CM | POA: Insufficient documentation

## 2019-06-09 DIAGNOSIS — Y939 Activity, unspecified: Secondary | ICD-10-CM | POA: Insufficient documentation

## 2019-06-09 DIAGNOSIS — X509XXA Other and unspecified overexertion or strenuous movements or postures, initial encounter: Secondary | ICD-10-CM | POA: Diagnosis not present

## 2019-06-09 DIAGNOSIS — Y999 Unspecified external cause status: Secondary | ICD-10-CM | POA: Insufficient documentation

## 2019-06-09 DIAGNOSIS — S99912A Unspecified injury of left ankle, initial encounter: Secondary | ICD-10-CM | POA: Diagnosis present

## 2019-06-09 DIAGNOSIS — Y929 Unspecified place or not applicable: Secondary | ICD-10-CM | POA: Diagnosis not present

## 2019-06-09 DIAGNOSIS — Z7984 Long term (current) use of oral hypoglycemic drugs: Secondary | ICD-10-CM | POA: Diagnosis not present

## 2019-06-09 NOTE — ED Provider Notes (Signed)
[facility] EMERGENCY DEPARTMENT Provider Note   CSN: [id number] Arrival date & time: 06/09/19  1311     History Chief Complaint  Patient presents with  . Ankle Pain    Ruth Reynolds is a 69 y.o. female with PMHx CKD and anemia who presents to the ED today with complaint of sudden onset, constant, unchanged, L ankle pain s/p ankle injury that occurred 3 months ago.  Patient reports 3 months ago her left leg gave out on her causing her to twist and invert her foot.  She states this caused immediate pain to the ankle.  She states that she was not evaluated initially because she thought she could handle the pain on her own.  She has however not been taking anything for pain.  She states she has not been taking anything as the pain is only present with ambulation.  She states she has still been able to ambulate on it however reports some pain with it. No other complaints at this time.   The history is provided by the patient and medical records.       History reviewed. No pertinent past medical history.  Patient Active Problem List   Diagnosis Date Noted  . Syncope 11/11/2014  . CKD (chronic kidney disease) 11/11/2014  . Anemia 11/11/2014    Past Surgical History:  Procedure Laterality Date  . ABDOMINAL HYSTERECTOMY       OB History   No obstetric history on file.     Family History  Problem Relation Age of Onset  . Heart disease Father     Social History   Tobacco Use  . Smoking status: Never Smoker  Substance Use Topics  . Alcohol use: Yes    Comment: occasionally  . Drug use: No    Home Medications Prior to Admission medications   Medication Sig Start Date End Date Taking? Authorizing Provider  Ascorbic Acid (VITAMIN C) 100 MG tablet Take 100 mg by mouth daily.    [provider]  Blood Glucose Monitoring Suppl (TRUE METRIX METER) DEVI 1 each by Does not apply route 3 (three) times daily before meals. 11/26/14   Hoy Register, MD   glucose blood (TRUE METRIX BLOOD GLUCOSE TEST) test strip Use as instructed 11/26/14   Hoy Register, MD  Goldenseal 400 MG CAPS Take 2 capsules by mouth.    [provider]  metFORMIN (GLUCOPHAGE) 500 MG tablet Take 1 tablet (500 mg total) by mouth 2 (two) times daily with a meal. For one week then 2 tablets (1000 mg) 2 times daily 11/26/14   Hoy Register, MD  Multiple Vitamins-Minerals (MULTIVITAMIN & MINERAL PO) Take 1 tablet by mouth daily.    [provider]  TRUEPLUS LANCETS 28G MISC 1 each by Does not apply route 2 (two) times daily. 11/26/14   Hoy Register, MD  vitamin E 100 UNIT capsule Take 100 Units by mouth daily.    [provider]    Allergies    Patient has no known allergies.  Review of Systems   Review of Systems  Constitutional: Negative for chills and fever.  Musculoskeletal: Positive for arthralgias.  Neurological: Negative for weakness and numbness.    Physical Exam Updated Vital Signs BP (!) 171/90   Pulse 93   Temp 98.6 F (37 C) (Oral)   Resp 16   Ht 5\' 6"  (1.676 m)   Wt 81.6 kg   SpO2 100%   BMI 29.05 kg/m   Physical Exam Vitals  and nursing note reviewed.  Constitutional:      Appearance: She is not ill-appearing.  HENT:     Head: Normocephalic and atraumatic.  Eyes:     Conjunctiva/sclera: Conjunctivae normal.  Cardiovascular:     Rate and Rhythm: Normal rate and regular rhythm.  Pulmonary:     Effort: Pulmonary effort is normal.     Breath sounds: Normal breath sounds.  Musculoskeletal:     Comments: Nonpitting edema bilaterally. No obvious swelling of L ankle compared to R. Very minimal TTP to lateral malleolus on L ankle. ROM intact to ankle. Dorsiflexion and plantarflexion intact. Strength and sensation intact. 2+ DP pulse.   Skin:    General: Skin is warm and dry.     Coloration: Skin is not jaundiced.  Neurological:     Mental Status: She is alert.     ED Results / Procedures / Treatments    Labs (all labs ordered are listed, but only abnormal results are displayed) Labs Reviewed - No data to display  EKG None  Radiology DG Ankle Complete Left  Result Date: 06/09/2019 CLINICAL DATA:  Twisted ankle 3 months ago. Persistent left ankle pain. Initial encounter. EXAM: LEFT ANKLE COMPLETE - 3+ VIEW COMPARISON:  None. FINDINGS: There is no evidence of fracture, dislocation, or joint effusion. There is no evidence of arthropathy or other focal bone abnormality. Generalized osteopenia noted as well as diffuse soft tissue swelling. IMPRESSION: Diffuse soft tissue swelling. No evidence of fracture or dislocation. Electronically Signed   By: Marlaine Hind M.D.   On: 06/09/2019 14:14    Procedures Procedures (including critical care time)  Medications Ordered in ED Medications - No data to display  ED Course  I have reviewed the triage vital signs and the nursing notes.  Pertinent labs & imaging results that were available during my care of the patient were reviewed by me and considered in my medical decision making (see chart for details).    MDM Rules/Calculators/A&P                       69 year old female presents the ED today complaining of persistent left ankle pain status post inversion injury that occurred 3 months ago.  Has not been evaluated for same.  At some pain with walking however not at rest.  No obvious swelling noted to the ankle today.  Very minimal tenderness to palpation of the lateral malleolus.  Pain x-ray at this time however suspect patient will need to follow-up with Ortho given the injury is 3 months out.  She has not been taking anything for pain, have advised that she take some Tylenol at home as needed for pain with ambulation.  Will likely discharge with ankle brace.  Patient is in agreement with plan.  Xray Negative.  ASO brace applied.  Patient advised to follow-up with Dr. Morrison Old with Ortho for further eval.  She is instructed to take Tylenol for pain.   She is in agreement with plan and stable for discharge home.  This note was prepared using Dragon voice recognition software and may include unintentional dictation errors due to the inherent limitations of voice recognition software.  Final Clinical Impression(s) / ED Diagnoses Final diagnoses:  Sprain of left ankle, unspecified ligament, initial encounter    Rx / DC Orders ED Discharge Orders    None       Discharge Instructions     Wear ankle brace as needed Please take Tylenol as needed  for pain Follow up with Dr. Roda Shutters with orthopedics for further evaluation given your symptoms have been ongoing for 3 months       Tanda Rockers, PA-C 06/09/19 1637    Rolan Bucco, MD 06/13/19 [phone]

## 2019-06-09 NOTE — ED Triage Notes (Signed)
C/o L ankle pain since "twisting ankle" 3 months ago.

## 2019-06-09 NOTE — ED Notes (Signed)
Patient verbalizes understanding of discharge instructions . Opportunity for questions and answers were provided . Armband removed by staff ,Pt discharged from ED. W/C  offered at D/C  and Declined W/C at D/C and was escorted to lobby by RN.  

## 2019-06-09 NOTE — Discharge Instructions (Addendum)
Wear ankle brace as needed Please take Tylenol as needed for pain Follow up with Dr. Roda Shutters with orthopedics for further evaluation given your symptoms have been ongoing for 3 months

## 2019-12-31 DIAGNOSIS — R609 Edema, unspecified: Secondary | ICD-10-CM | POA: Diagnosis not present

## 2019-12-31 DIAGNOSIS — S93409A Sprain of unspecified ligament of unspecified ankle, initial encounter: Secondary | ICD-10-CM | POA: Diagnosis not present

## 2019-12-31 DIAGNOSIS — Z0000 Encounter for general adult medical examination without abnormal findings: Secondary | ICD-10-CM | POA: Diagnosis not present

## 2019-12-31 DIAGNOSIS — I10 Essential (primary) hypertension: Secondary | ICD-10-CM | POA: Diagnosis not present

## 2019-12-31 DIAGNOSIS — Z6828 Body mass index (BMI) 28.0-28.9, adult: Secondary | ICD-10-CM | POA: Diagnosis not present

## 2019-12-31 DIAGNOSIS — E663 Overweight: Secondary | ICD-10-CM | POA: Diagnosis not present

## 2019-12-31 DIAGNOSIS — Z79899 Other long term (current) drug therapy: Secondary | ICD-10-CM | POA: Diagnosis not present

## 2019-12-31 DIAGNOSIS — Z008 Encounter for other general examination: Secondary | ICD-10-CM | POA: Diagnosis not present

## 2019-12-31 DIAGNOSIS — Z1159 Encounter for screening for other viral diseases: Secondary | ICD-10-CM | POA: Diagnosis not present

## 2020-02-23 ENCOUNTER — Encounter (HOSPITAL_COMMUNITY): Payer: Self-pay | Admitting: Internal Medicine

## 2020-02-23 ENCOUNTER — Other Ambulatory Visit: Payer: Self-pay

## 2020-02-23 ENCOUNTER — Emergency Department (HOSPITAL_COMMUNITY): Payer: Medicare Other

## 2020-02-23 ENCOUNTER — Inpatient Hospital Stay (HOSPITAL_COMMUNITY)
Admission: EM | Admit: 2020-02-23 | Discharge: 2020-03-10 | DRG: 064 | Disposition: A | Discharge status: Skilled Nursing Facility | Payer: Medicare Other | Attending: Internal Medicine | Admitting: Internal Medicine

## 2020-02-23 DIAGNOSIS — N19 Unspecified kidney failure: Secondary | ICD-10-CM | POA: Diagnosis not present

## 2020-02-23 DIAGNOSIS — R32 Unspecified urinary incontinence: Secondary | ICD-10-CM | POA: Diagnosis present

## 2020-02-23 DIAGNOSIS — I6381 Other cerebral infarction due to occlusion or stenosis of small artery: Principal | ICD-10-CM | POA: Diagnosis present

## 2020-02-23 DIAGNOSIS — N179 Acute kidney failure, unspecified: Secondary | ICD-10-CM | POA: Diagnosis present

## 2020-02-23 DIAGNOSIS — I5030 Unspecified diastolic (congestive) heart failure: Secondary | ICD-10-CM | POA: Diagnosis present

## 2020-02-23 DIAGNOSIS — G9341 Metabolic encephalopathy: Secondary | ICD-10-CM | POA: Diagnosis present

## 2020-02-23 DIAGNOSIS — Z7984 Long term (current) use of oral hypoglycemic drugs: Secondary | ICD-10-CM

## 2020-02-23 DIAGNOSIS — R531 Weakness: Secondary | ICD-10-CM

## 2020-02-23 DIAGNOSIS — I429 Cardiomyopathy, unspecified: Secondary | ICD-10-CM | POA: Diagnosis present

## 2020-02-23 DIAGNOSIS — D649 Anemia, unspecified: Secondary | ICD-10-CM

## 2020-02-23 DIAGNOSIS — Z87820 Personal history of traumatic brain injury: Secondary | ICD-10-CM

## 2020-02-23 DIAGNOSIS — I6330 Cerebral infarction due to thrombosis of unspecified cerebral artery: Secondary | ICD-10-CM

## 2020-02-23 DIAGNOSIS — Z6829 Body mass index (BMI) 29.0-29.9, adult: Secondary | ICD-10-CM

## 2020-02-23 DIAGNOSIS — I110 Hypertensive heart disease with heart failure: Secondary | ICD-10-CM | POA: Diagnosis present

## 2020-02-23 DIAGNOSIS — Z794 Long term (current) use of insulin: Secondary | ICD-10-CM

## 2020-02-23 DIAGNOSIS — Y92003 Bedroom of unspecified non-institutional (private) residence as the place of occurrence of the external cause: Secondary | ICD-10-CM

## 2020-02-23 DIAGNOSIS — E1165 Type 2 diabetes mellitus with hyperglycemia: Secondary | ICD-10-CM | POA: Diagnosis present

## 2020-02-23 DIAGNOSIS — R627 Adult failure to thrive: Secondary | ICD-10-CM | POA: Diagnosis present

## 2020-02-23 DIAGNOSIS — D75839 Thrombocytosis, unspecified: Secondary | ICD-10-CM | POA: Diagnosis present

## 2020-02-23 DIAGNOSIS — I5042 Chronic combined systolic (congestive) and diastolic (congestive) heart failure: Secondary | ICD-10-CM | POA: Diagnosis present

## 2020-02-23 DIAGNOSIS — Z79899 Other long term (current) drug therapy: Secondary | ICD-10-CM

## 2020-02-23 DIAGNOSIS — N390 Urinary tract infection, site not specified: Secondary | ICD-10-CM | POA: Diagnosis present

## 2020-02-23 DIAGNOSIS — W19XXXA Unspecified fall, initial encounter: Secondary | ICD-10-CM | POA: Diagnosis present

## 2020-02-23 DIAGNOSIS — I671 Cerebral aneurysm, nonruptured: Secondary | ICD-10-CM | POA: Diagnosis present

## 2020-02-23 DIAGNOSIS — R3915 Urgency of urination: Secondary | ICD-10-CM | POA: Diagnosis present

## 2020-02-23 DIAGNOSIS — I5032 Chronic diastolic (congestive) heart failure: Secondary | ICD-10-CM | POA: Diagnosis not present

## 2020-02-23 DIAGNOSIS — E860 Dehydration: Secondary | ICD-10-CM | POA: Diagnosis present

## 2020-02-23 DIAGNOSIS — U071 COVID-19: Secondary | ICD-10-CM

## 2020-02-23 DIAGNOSIS — Z8249 Family history of ischemic heart disease and other diseases of the circulatory system: Secondary | ICD-10-CM

## 2020-02-23 DIAGNOSIS — D509 Iron deficiency anemia, unspecified: Secondary | ICD-10-CM | POA: Diagnosis present

## 2020-02-23 HISTORY — DX: Type 2 diabetes mellitus without complications: E11.9

## 2020-02-23 HISTORY — DX: Iron deficiency anemia, unspecified: D50.9

## 2020-02-23 LAB — URINALYSIS, ROUTINE W REFLEX MICROSCOPIC
Bilirubin Urine: NEGATIVE
Glucose, UA: >=500 mg/dL — AB
Hgb urine dipstick: NEGATIVE
Ketones, ur: NEGATIVE mg/dL
Nitrite: NEGATIVE
Protein, ur: 100 mg/dL — AB
Specific Gravity, Urine: 1.02 (ref 1.005–1.030)
pH: 5 (ref 5.0–8.0)

## 2020-02-23 LAB — IRON AND TIBC
Iron: 15 ug/dL — ABNORMAL LOW (ref 28–170)
Iron: 13 ug/dL — ABNORMAL LOW (ref 28–170)
Saturation Ratios: 4 % — ABNORMAL LOW (ref 10.4–31.8)
Saturation Ratios: 4 % — ABNORMAL LOW (ref 10.4–31.8)
TIBC: 368 ug/dL (ref 250–450)
TIBC: 368 ug/dL (ref 250–450)
UIBC: 353 ug/dL
UIBC: 355 ug/dL

## 2020-02-23 LAB — CBC
HCT: 25 % — ABNORMAL LOW (ref 36.0–46.0)
Hemoglobin: 7.8 g/dL — ABNORMAL LOW (ref 12.0–15.0)
MCH: 22.7 pg — ABNORMAL LOW (ref 26.0–34.0)
MCHC: 31.2 g/dL (ref 30.0–36.0)
MCV: 72.7 fL — ABNORMAL LOW (ref 80.0–100.0)
Platelets: 500 10*3/uL — ABNORMAL HIGH (ref 150–400)
RBC: 3.44 MIL/uL — ABNORMAL LOW (ref 3.87–5.11)
RDW: 19.9 % — ABNORMAL HIGH (ref 11.5–15.5)
WBC: 8.1 10*3/uL (ref 4.0–10.5)
nRBC: 0.2 % (ref 0.0–0.2)

## 2020-02-23 LAB — BASIC METABOLIC PANEL
Anion gap: 14 (ref 5–15)
BUN: 26 mg/dL — ABNORMAL HIGH (ref 8–23)
CO2: 22 mmol/L (ref 22–32)
Calcium: 8.6 mg/dL — ABNORMAL LOW (ref 8.9–10.3)
Chloride: 99 mmol/L (ref 98–111)
Creatinine, Ser: 1.31 mg/dL — ABNORMAL HIGH (ref 0.44–1.00)
GFR, Estimated: 44 mL/min — ABNORMAL LOW (ref >60)
Glucose, Bld: 330 mg/dL — ABNORMAL HIGH (ref 70–99)
Potassium: 4.2 mmol/L (ref 3.5–5.1)
Sodium: 135 mmol/L (ref 135–145)

## 2020-02-23 LAB — RETICULOCYTES
Immature Retic Fract: 19.8 % — ABNORMAL HIGH (ref 2.3–15.9)
Immature Retic Fract: 17.6 % — ABNORMAL HIGH (ref 2.3–15.9)
RBC.: 3.46 MIL/uL — ABNORMAL LOW (ref 3.87–5.11)
RBC.: 3.51 MIL/uL — ABNORMAL LOW (ref 3.87–5.11)
Retic Count, Absolute: 23.2 10*3/uL (ref 19.0–186.0)
Retic Count, Absolute: 21.4 10*3/uL (ref 19.0–186.0)
Retic Ct Pct: 0.7 % (ref 0.4–3.1)
Retic Ct Pct: 0.6 % (ref 0.4–3.1)

## 2020-02-23 LAB — ABO/RH: ABO/RH(D): O POS

## 2020-02-23 LAB — HEPATIC FUNCTION PANEL
ALT: 11 U/L (ref 0–44)
AST: 18 U/L (ref 15–41)
Albumin: 2.9 g/dL — ABNORMAL LOW (ref 3.5–5.0)
Alkaline Phosphatase: 72 U/L (ref 38–126)
Bilirubin, Direct: 0.1 mg/dL (ref 0.0–0.2)
Indirect Bilirubin: 0.4 mg/dL (ref 0.3–0.9)
Total Bilirubin: 0.5 mg/dL (ref 0.3–1.2)
Total Protein: 8.2 g/dL — ABNORMAL HIGH (ref 6.5–8.1)

## 2020-02-23 LAB — SARS CORONAVIRUS 2 (TAT 6-24 HRS): SARS Coronavirus 2: POSITIVE — AB

## 2020-02-23 LAB — FOLATE
Folate: 36.6 ng/mL (ref >5.9)
Folate: 33.8 ng/mL (ref >5.9)

## 2020-02-23 LAB — TSH: TSH: 0.515 u[IU]/mL (ref 0.350–4.500)

## 2020-02-23 LAB — CK: Total CK: 130 U/L (ref 38–234)

## 2020-02-23 LAB — HEMOGLOBIN A1C
Hgb A1c MFr Bld: 9.8 % — ABNORMAL HIGH (ref 4.8–5.6)
Mean Plasma Glucose: 234.56 mg/dL

## 2020-02-23 LAB — LIPASE, BLOOD: Lipase: 27 U/L (ref 11–51)

## 2020-02-23 LAB — VITAMIN B12
Vitamin B-12: 316 pg/mL (ref 180–914)
Vitamin B-12: 337 pg/mL (ref 180–914)

## 2020-02-23 LAB — FERRITIN
Ferritin: 18 ng/mL (ref 11–307)
Ferritin: 19 ng/mL (ref 11–307)

## 2020-02-23 LAB — BRAIN NATRIURETIC PEPTIDE: B Natriuretic Peptide: 36.3 pg/mL (ref 0.0–100.0)

## 2020-02-23 LAB — POC OCCULT BLOOD, ED: Fecal Occult Bld: NEGATIVE

## 2020-02-23 LAB — HIV ANTIBODY (ROUTINE TESTING W REFLEX): HIV Screen 4th Generation wRfx: NONREACTIVE

## 2020-02-23 MED ORDER — TRAMADOL HCL 50 MG PO TABS
50.0000 mg | ORAL_TABLET | Freq: Three times a day (TID) | ORAL | Status: DC | PRN
  Administered 2020-02-24 – 2020-02-25 (×3): 50 mg via ORAL
  Filled 2020-02-23 (×3): qty 1

## 2020-02-23 MED ORDER — ENOXAPARIN SODIUM 40 MG/0.4ML ~~LOC~~ SOLN
40.0000 mg | SUBCUTANEOUS | Status: DC
  Administered 2020-02-24 – 2020-02-27 (×5): 40 mg via SUBCUTANEOUS
  Filled 2020-02-23 (×5): qty 0.4

## 2020-02-23 MED ORDER — SODIUM CHLORIDE 0.9 % IV BOLUS
500.0000 mL | Freq: Once | INTRAVENOUS | Status: AC
  Administered 2020-02-23: 500 mL via INTRAVENOUS

## 2020-02-23 MED ORDER — ADULT MULTIVITAMIN W/MINERALS CH
1.0000 | ORAL_TABLET | Freq: Every day | ORAL | Status: DC
  Administered 2020-02-24 – 2020-03-10 (×16): 1 via ORAL
  Filled 2020-02-23 (×18): qty 1

## 2020-02-23 MED ORDER — SODIUM CHLORIDE 0.45 % IV SOLN: INTRAVENOUS | Status: DC

## 2020-02-23 MED ORDER — INSULIN ASPART 100 UNIT/ML ~~LOC~~ SOLN
0.0000 [IU] | Freq: Three times a day (TID) | SUBCUTANEOUS | Status: DC
  Administered 2020-02-24: 5 [IU] via SUBCUTANEOUS
  Administered 2020-02-24: 3 [IU] via SUBCUTANEOUS
  Administered 2020-02-24: 5 [IU] via SUBCUTANEOUS
  Administered 2020-02-25: 2 [IU] via SUBCUTANEOUS
  Administered 2020-02-25: 3 [IU] via SUBCUTANEOUS
  Administered 2020-02-25 – 2020-02-26 (×3): 2 [IU] via SUBCUTANEOUS
  Administered 2020-02-27: 8 [IU] via SUBCUTANEOUS
  Administered 2020-02-28: 2 [IU] via SUBCUTANEOUS
  Administered 2020-02-28 (×2): 3 [IU] via SUBCUTANEOUS
  Administered 2020-02-29: 5 [IU] via SUBCUTANEOUS
  Administered 2020-02-29: 3 [IU] via SUBCUTANEOUS
  Administered 2020-02-29 – 2020-03-01 (×2): 2 [IU] via SUBCUTANEOUS
  Administered 2020-03-01: 3 [IU] via SUBCUTANEOUS
  Administered 2020-03-01 – 2020-03-04 (×8): 2 [IU] via SUBCUTANEOUS
  Administered 2020-03-05 – 2020-03-06 (×3): 3 [IU] via SUBCUTANEOUS
  Administered 2020-03-06: 5 [IU] via SUBCUTANEOUS
  Administered 2020-03-07 (×2): 3 [IU] via SUBCUTANEOUS
  Administered 2020-03-07: 2 [IU] via SUBCUTANEOUS
  Administered 2020-03-08: 3 [IU] via SUBCUTANEOUS
  Administered 2020-03-08: 2 [IU] via SUBCUTANEOUS
  Administered 2020-03-09 – 2020-03-10 (×2): 3 [IU] via SUBCUTANEOUS

## 2020-02-23 MED ORDER — SENNA 8.6 MG PO TABS
1.0000 | ORAL_TABLET | Freq: Two times a day (BID) | ORAL | Status: DC
  Administered 2020-02-23 – 2020-03-10 (×25): 8.6 mg via ORAL
  Filled 2020-02-23 (×29): qty 1

## 2020-02-23 MED ORDER — CIPROFLOXACIN HCL 500 MG PO TABS
250.0000 mg | ORAL_TABLET | Freq: Two times a day (BID) | ORAL | Status: DC
  Administered 2020-02-23 – 2020-02-24 (×3): 250 mg via ORAL
  Filled 2020-02-23 (×3): qty 1

## 2020-02-23 NOTE — H&P (Signed)
History and Physical    Ruth Reynolds ZOX:[id number] DOB: 03/19/1950 DOA: 02/23/2020  PCP: Patient, No Pcp Per (Confirm with patient/family/NH records and if not entered, [facility]) Patient coming from: home  I have personally briefly reviewed patient's old medical records [facility]  Chief Complaint: Found down  HPI: Ruth Reynolds is a 70 y.o. female with medical history significant of DM2, chronic anemia, h/o SDH after fall, HFpEF. She presents to the ED via EMS after being found on the floor. Patient states she was sleeping on the floor because her bed was filled with magazines and she felt like that was the best place to fall asleep. Spoke to patient's niece who notes that patient fell yesterday and has been on the floor for roughly 24 hours. She currently lives with her niece who notes she attempted to help patient up however, patient did not want any help and continued to lay on the floor for roughly 1 day. Niece reports generalized weakness over the past few weeks with increased fatigue. He is also notes that patient has been urinating on herself recently due to the inability to get to the bathroom quick enough. Over the past few months she has been ambulating with a cane due to "fluid in her foot". No fever or chills. Patient denies chest pain, shortness of breath, abdominal pain, nausea, vomiting, diarrhea.   ED Course: T 98.6  103/61  HR 86  RR 19. ED-PA exam notable for abdominal tenderness, heme negative stool on rectal exam. Lab: glucose 330, AG 14, CK total 130, Fe low with increased reticulocytes. Cr 1.31 (0.94 11/12/14) WBC 8.1  Hgb 7.8, U/A hazy, small LE, many bacteria, 11-20 WBC/hpf. TRH called to admit for continued evaluation, treatment of AKI-prerenal azotemia, treatment UTI.  Review of Systems: As per HPI otherwise 10 point review of systems negative.    Past Medical History:  Diagnosis Date  . (HFpEF) heart failure with preserved  ejection fraction (HCC) 2016   Echo with EF 55-60%, Grade II diastolic dysfxn  . DM2 (diabetes mellitus, type 2) (HCC)   . Iron deficiency anemia   . SDH (subdural hematoma) (HCC) 2019   after fall. Left. No sequellae    Past Surgical History:  Procedure Laterality Date  . ABDOMINAL HYSTERECTOMY    . ABDOMINAL HYSTERECTOMY     Soc Hx - Maiden, no children. She has been living with her niece. [place] in [place]. She stays in touch with her sister. She isn't working but has worked in [employer].     reports that she has never smoked. She does not have any smokeless tobacco history on file. She reports current alcohol use. She reports that she does not use drugs.  No Known Allergies  Family History  Problem Relation Age of Onset  . Heart disease Father      Prior to Admission medications   Medication Sig Start Date End Date Taking? Authorizing Provider  Ascorbic Acid (VITAMIN C) 100 MG tablet Take 100 mg by mouth daily.    [provider]  Blood Glucose Monitoring Suppl (TRUE METRIX METER) DEVI 1 each by Does not apply route 3 (three) times daily before meals. 11/26/14   Hoy RegisterNewlin, Enobong, MD  glucose blood (TRUE METRIX BLOOD GLUCOSE TEST) test strip Use as instructed 11/26/14   Hoy RegisterNewlin, Enobong, MD  Goldenseal 400 MG CAPS Take 2 capsules by mouth.    [provider]  lisinopril-hydrochlorothiazide (ZESTORETIC) 20-12.5 MG tablet Take 1 tablet by mouth daily. 01/01/20   [provider]  metFORMIN (GLUCOPHAGE) 500 MG tablet Take 1 tablet (500 mg total) by mouth 2 (two) times daily with a meal. For one week then 2 tablets (1000 mg) 2 times daily 11/26/14   Hoy Register, MD  Multiple Vitamins-Minerals (MULTIVITAMIN & MINERAL PO) Take 1 tablet by mouth daily.    [provider]  TRUEPLUS LANCETS 28G MISC 1 each by Does not apply route 2 (two) times daily. 11/26/14   Hoy Register, MD  vitamin E 100 UNIT capsule Take 100 Units by mouth  daily.    [provider]    Physical Exam: Vitals:   02/23/20 1215 02/23/20 1230 02/23/20 1245 02/23/20 1300  BP:      Pulse: 72 68  66  Resp: 19 18 16 17   Temp:      TempSrc:      SpO2: 98% 99%  98%     Vitals:   02/23/20 1215 02/23/20 1230 02/23/20 1245 02/23/20 1300  BP:      Pulse: 72 68  66  Resp: 19 18 16 17   Temp:      TempSrc:      SpO2: 98% 99%  98%   General: Heavyset woman who is very slow to respond, mumbles more than speaks, denies discomfort or pain. Eyes: PERRL, lids and conjunctivae normal ENMT: Mucous membranes are dry. .Dentures  Neck: normal, supple, no masses, no thyromegaly Respiratory: clear to auscultation bilaterally, no wheezing, no crackles. Normal respiratory effort. No accessory muscle use.  Cardiovascular: Regular rate and rhythm, no murmurs / rubs / gallops. No extremity edema. 1+ pedal pulses. No carotid bruits.  Abdomen: overweight,  no tenderness, no masses palpated. No hepatosplenomegaly. Bowel sounds hypoactive.  Musculoskeletal: no clubbing / cyanosis. No joint deformity upper and lower extremities. Good ROM, no contractures. Normal muscle tone.  Skin: patchy alopecia on right scalp and vertex. no rashes, lesions, ulcers. No induration Neurologic: CN 2-12 nl facial symmetry and movement, EOMI, normal gaze, MAE to command, Grip strength 5/5, LE strength with nl leg lift. . Sensation intact to light touch, DTR normal at patellar tendon.. .  Psychiatric:  Psychomotor retardation - very slow to mumble answers, oriented to [facility], needed to be coached to city, could not state her address, did not know president .    Labs on Admission: I have personally reviewed following labs and imaging studies  CBC: Recent Labs  Lab 02/23/20 0255  WBC 8.1  HGB 7.8*  HCT 25.0*  MCV 72.7*  PLT 500*   Basic Metabolic Panel: Recent Labs  Lab 02/23/20 0255  NA 135  K 4.2  CL 99  CO2 22  GLUCOSE 330*  BUN 26*  CREATININE 1.31*  CALCIUM  8.6*   GFR: CrCl cannot be calculated (Unknown ideal weight.). Liver Function Tests: Recent Labs  Lab 02/23/20 0954  AST 18  ALT 11  ALKPHOS 72  BILITOT 0.5  PROT 8.2*  ALBUMIN 2.9*   Recent Labs  Lab 02/23/20 0954  LIPASE 27   No results for input(s): AMMONIA in the last 168 hours. Coagulation Profile: No results for input(s): INR, PROTIME in the last 168 hours. Cardiac Enzymes: Recent Labs  Lab 02/23/20 0954  CKTOTAL 130   BNP (last 3 results) No results for input(s): PROBNP in the last 8760 hours. HbA1C: No results for input(s): HGBA1C in the last 72 hours. CBG: No results for input(s): GLUCAP in  the last 168 hours. Lipid Profile: No results for input(s): CHOL, HDL, LDLCALC, TRIG, CHOLHDL, LDLDIRECT in the last 72 hours. Thyroid Function Tests: No results for input(s): TSH, T4TOTAL, FREET4, T3FREE, THYROIDAB in the last 72 hours. Anemia Panel: Recent Labs    02/23/20 0954  VITAMINB12 316  FOLATE 36.6  FERRITIN 18  TIBC 368  IRON 15*  RETICCTPCT 0.7   Urine analysis:    Component Value Date/Time   COLORURINE YELLOW 02/23/2020 0955   APPEARANCEUR HAZY (A) 02/23/2020 0955   LABSPEC 1.020 02/23/2020 0955   PHURINE 5.0 02/23/2020 0955   GLUCOSEU >=500 (A) 02/23/2020 0955   HGBUR NEGATIVE 02/23/2020 0955   BILIRUBINUR NEGATIVE 02/23/2020 0955   KETONESUR NEGATIVE 02/23/2020 0955   PROTEINUR 100 (A) 02/23/2020 0955   NITRITE NEGATIVE 02/23/2020 0955   LEUKOCYTESUR SMALL (A) 02/23/2020 0955    Radiological Exams on Admission: CT Head Wo Contrast  Result Date: 02/23/2020 CLINICAL DATA:  Neck trauma. EXAM: CT HEAD WITHOUT CONTRAST CT CERVICAL SPINE WITHOUT CONTRAST TECHNIQUE: Multidetector CT imaging of the head and cervical spine was performed following the standard protocol without intravenous contrast. Multiplanar CT image reconstructions of the cervical spine were also generated. COMPARISON:  None. FINDINGS: CT HEAD FINDINGS Brain: No evidence of  acute infarction, hemorrhage, hydrocephalus, extra-axial collection or mass lesion/mass effect. There is chronic diffuse atrophy. Chronic bilateral periventricular white matter small vessel ischemic changes noted. Vascular: No hyperdense vessel is noted. Skull: Normal. Negative for fracture or focal lesion. Sinuses/Orbits: No acute finding. Other: None. CT CERVICAL SPINE FINDINGS Alignment: Normal. Skull base and vertebrae: No acute fracture. No primary bone lesion or focal pathologic process. Soft tissues and spinal canal: No prevertebral fluid or swelling. No visible canal hematoma. Disc levels: Degenerative joint changes with narrowed joint space and osteophyte formation identified mid to lower cervical spine. Upper chest: Negative. Other: None. IMPRESSION: 1. No focal acute intracranial abnormality identified. 2. Chronic diffuse atrophy and chronic bilateral periventricular white matter small vessel ischemic change. 3. No acute fracture or dislocation of cervical spine. 4. Degenerative joint changes of cervical spine. Electronically Signed   By: Sherian Rein M.D.   On: 02/23/2020 08:44   CT Cervical Spine Wo Contrast  Result Date: 02/23/2020 CLINICAL DATA:  Neck trauma. EXAM: CT HEAD WITHOUT CONTRAST CT CERVICAL SPINE WITHOUT CONTRAST TECHNIQUE: Multidetector CT imaging of the head and cervical spine was performed following the standard protocol without intravenous contrast. Multiplanar CT image reconstructions of the cervical spine were also generated. COMPARISON:  None. FINDINGS: CT HEAD FINDINGS Brain: No evidence of acute infarction, hemorrhage, hydrocephalus, extra-axial collection or mass lesion/mass effect. There is chronic diffuse atrophy. Chronic bilateral periventricular white matter small vessel ischemic changes noted. Vascular: No hyperdense vessel is noted. Skull: Normal. Negative for fracture or focal lesion. Sinuses/Orbits: No acute finding. Other: None. CT CERVICAL SPINE FINDINGS Alignment:  Normal. Skull base and vertebrae: No acute fracture. No primary bone lesion or focal pathologic process. Soft tissues and spinal canal: No prevertebral fluid or swelling. No visible canal hematoma. Disc levels: Degenerative joint changes with narrowed joint space and osteophyte formation identified mid to lower cervical spine. Upper chest: Negative. Other: None. IMPRESSION: 1. No focal acute intracranial abnormality identified. 2. Chronic diffuse atrophy and chronic bilateral periventricular white matter small vessel ischemic change. 3. No acute fracture or dislocation of cervical spine. 4. Degenerative joint changes of cervical spine. Electronically Signed   By: Sherian Rein M.D.   On: 02/23/2020 08:44    EKG: Independently  reviewed. Sinus tachycardia, LAD, LVH  Assessment/Plan Active Problems:   Acute prerenal azotemia   Hyperglycemia due to diabetes mellitus (HCC)   Acute metabolic encephalopathy   Acute lower UTI   (HFpEF) heart failure with preserved ejection fraction (HCC)   Anemia     1. Acute pre-renal azotemia 2/2 dehydration. On chart review no prior h/o CKD. Last Cr 0.94 2016. Plan Hydration with 1/2 NS at 75 cc/hr  F/u Bmet in AM  2. UTI - patient with urinary urgency leading to incontinence. U/A positive. Hemodynamically stable Plan Cipro tabs 250 mg BID x 3 days  3. Hyperglycemia - Patient has not been taking medication. Denied knowing she had DM but Epic records indicate this was diagnosed in 2019 and she was supposed to be taking metformin Plan Hold metform 2/2 renal insufficiency  A1C  Sliding scale coverage  4. Acute metabolic encephalopathy - family reports she has been less active and more withdrawn. No report of neuro symptoms or evidence of memory loss or behavior change. Plan Address issues above and reassess  5. Anemia - preexisting problem with prior iron deficiency. Hgb 7.8 at admission and expect this to drop with hydration. No distress, SOB at this  time Plan Anemia Panel  IV iron if persistent Iron deficiency  Consider transfusion for Hgb <6.0 g  6. HFpEF - echo October  2016 with EF 55-60%, Grade II diastolic dysfunction. Patient asymptomatic but was recently treated for LE edema. Plan BNP  Echocardiogram  7. Disposition - patient staying with niece but they are trying to find elder housing. Patient has had decreased mobility. Plan TOC consultobs  PT/OT evaluation    DVT prophylaxis: lovenox  Code Status: full code  Family Communication: spoke with Helmut Muster - niece. Explained Dx's and Tx plan. Answered questions  Disposition Plan: TBD  Consults called: none (with names) Admission status: observation    Illene Regulus MD [facility] Pager [phone]  If 7PM-7AM, please contact night-coverage [email or web address] Password [email or web address]  02/23/2020, 2:20 PM

## 2020-02-23 NOTE — ED Provider Notes (Signed)
[facility] EMERGENCY DEPARTMENT Provider Note   CSN: [id number] Arrival date & time: 02/23/20  0204     History Chief Complaint  Patient presents with  . Weakness    Ruth Reynolds is a 70 y.o. female with a past medical history significant for CKD and anemia who presents to the ED via EMS after being found on the floor. Patient states she was sleeping on the floor because her bed was filled with magazines and she felt like that was the best place to fall asleep. Spoke to patient's niece who notes that patient fell yesterday and has been on the floor for roughly 24 hours. She currently lives with her niece who notes she attempted to help patient up however, patient did not want any help and continued to lay on the floor for roughly 1 day. Niece reports generalized weakness over the past few weeks with increased fatigue. He is also notes that patient has been urinating on herself recently due to the inability to get to the bathroom quick enough. Over the past few months she has been ambulating with a cane due to "fluid in her foot". No fever or chills. Patient denies chest pain, shortness of breath, abdominal pain, nausea, vomiting, diarrhea.  History obtained from patient, niece, and past medical records. No interpreter used during encounter.      No past medical history on file.  Patient Active Problem List   Diagnosis Date Noted  . Syncope 11/11/2014  . CKD (chronic kidney disease) 11/11/2014  . Anemia 11/11/2014    Past Surgical History:  Procedure Laterality Date  . ABDOMINAL HYSTERECTOMY       OB History   No obstetric history on file.     Family History  Problem Relation Age of Onset  . Heart disease Father     Social History   Tobacco Use  . Smoking status: Never Smoker  Substance Use Topics  . Alcohol use: Yes    Comment: occasionally  . Drug use: No    Home Medications Prior to Admission medications   Medication Sig Start Date End Date  Taking? Authorizing Provider  Ascorbic Acid (VITAMIN C) 100 MG tablet Take 100 mg by mouth daily.    [provider]  Blood Glucose Monitoring Suppl (TRUE METRIX METER) DEVI 1 each by Does not apply route 3 (three) times daily before meals. 11/26/14   Hoy Register, MD  glucose blood (TRUE METRIX BLOOD GLUCOSE TEST) test strip Use as instructed 11/26/14   Hoy Register, MD  Goldenseal 400 MG CAPS Take 2 capsules by mouth.    [provider]  metFORMIN (GLUCOPHAGE) 500 MG tablet Take 1 tablet (500 mg total) by mouth 2 (two) times daily with a meal. For one week then 2 tablets (1000 mg) 2 times daily 11/26/14   Hoy Register, MD  Multiple Vitamins-Minerals (MULTIVITAMIN & MINERAL PO) Take 1 tablet by mouth daily.    [provider]  TRUEPLUS LANCETS 28G MISC 1 each by Does not apply route 2 (two) times daily. 11/26/14   Hoy Register, MD  vitamin E 100 UNIT capsule Take 100 Units by mouth daily.    [provider]    Allergies    Patient has no known allergies.  Review of Systems   Review of Systems  Constitutional: Positive for fatigue. Negative for chills and fever.  Respiratory: Negative for shortness of breath.   Cardiovascular: Negative for chest pain.  Gastrointestinal: Negative for abdominal pain, diarrhea, nausea  and vomiting.  Genitourinary: Negative for dysuria.  Neurological: Positive for weakness (generalized weakness).  All other systems reviewed and are negative.   Physical Exam Updated Vital Signs BP 103/61   Pulse 86   Temp 98.6 F (37 C)   Resp 19   SpO2 99%   Physical Exam Vitals and nursing note reviewed.  Constitutional:      General: She is not in acute distress.    Appearance: She is not toxic-appearing.  HENT:     Head: Normocephalic.  Eyes:     Pupils: Pupils are equal, round, and reactive to light.  Cardiovascular:     Rate and Rhythm: Normal rate and regular rhythm.     Pulses: Normal pulses.     Heart  sounds: Normal heart sounds. No murmur heard. No friction rub. No gallop.   Pulmonary:     Effort: Pulmonary effort is normal.     Breath sounds: Normal breath sounds.  Abdominal:     General: Abdomen is flat. Bowel sounds are normal. There is no distension.     Palpations: Abdomen is soft.     Tenderness: There is abdominal tenderness. There is no guarding or rebound.     Comments: Generalized abdominal tenderness. No rebound or guarding  Musculoskeletal:     Cervical back: Neck supple.     Comments: No lower extremity edema.   Skin:    General: Skin is warm and dry.  Neurological:     General: No focal deficit present.     Mental Status: She is alert.     Comments: Speech is clear, able to follow commands CN III-XII intact Normal strength in upper and lower extremities bilaterally including dorsiflexion and plantar flexion, strong and equal grip strength Sensation grossly intact throughout Moves extremities without ataxia, coordination intact No pronator drift   Psychiatric:        Mood and Affect: Mood normal.        Behavior: Behavior normal.     ED Results / Procedures / Treatments   Labs (all labs ordered are listed, but only abnormal results are displayed) Labs Reviewed  BASIC METABOLIC PANEL - Abnormal; Notable for the following components:      Result Value   Glucose, Bld 330 (*)    BUN 26 (*)    Creatinine, Ser 1.31 (*)    Calcium 8.6 (*)    GFR, Estimated 44 (*)    All other components within normal limits  CBC - Abnormal; Notable for the following components:   RBC 3.44 (*)    Hemoglobin 7.8 (*)    HCT 25.0 (*)    MCV 72.7 (*)    MCH 22.7 (*)    RDW 19.9 (*)    Platelets 500 (*)    All other components within normal limits  URINALYSIS, ROUTINE W REFLEX MICROSCOPIC - Abnormal; Notable for the following components:   APPearance HAZY (*)    Glucose, UA >=500 (*)    Protein, ur 100 (*)    Leukocytes,Ua SMALL (*)    Bacteria, UA MANY (*)    All other  components within normal limits  HEPATIC FUNCTION PANEL - Abnormal; Notable for the following components:   Total Protein 8.2 (*)    Albumin 2.9 (*)    All other components within normal limits  IRON AND TIBC - Abnormal; Notable for the following components:   Iron 15 (*)    Saturation Ratios 4 (*)    All other components within  normal limits  RETICULOCYTES - Abnormal; Notable for the following components:   RBC. 3.46 (*)    Immature Retic Fract 19.8 (*)    All other components within normal limits  SARS CORONAVIRUS 2 (TAT 6-24 HRS)  URINE CULTURE  CK  LIPASE, BLOOD  VITAMIN B12  FOLATE  FERRITIN  POC OCCULT BLOOD, ED  TYPE AND SCREEN    EKG EKG Interpretation  Date/Time:  Sunday February 23 2020 02:32:30 EST Ventricular Rate:  106 PR Interval:  162 QRS Duration: 90 QT Interval:  348 QTC Calculation: 462 R Axis:   -36 Text Interpretation: Sinus tachycardia Left axis deviation Moderate voltage criteria for LVH, may be normal variant ( R in aVL , Cornell product ) Abnormal ECG No significant change since last tracing Confirmed by Melene PlanFloyd, Dan [phone]([id number]) on 02/23/2020 8:13:14 AM   Radiology CT Head Wo Contrast  Result Date: 02/23/2020 CLINICAL DATA:  Neck trauma. EXAM: CT HEAD WITHOUT CONTRAST CT CERVICAL SPINE WITHOUT CONTRAST TECHNIQUE: Multidetector CT imaging of the head and cervical spine was performed following the standard protocol without intravenous contrast. Multiplanar CT image reconstructions of the cervical spine were also generated. COMPARISON:  None. FINDINGS: CT HEAD FINDINGS Brain: No evidence of acute infarction, hemorrhage, hydrocephalus, extra-axial collection or mass lesion/mass effect. There is chronic diffuse atrophy. Chronic bilateral periventricular white matter small vessel ischemic changes noted. Vascular: No hyperdense vessel is noted. Skull: Normal. Negative for fracture or focal lesion. Sinuses/Orbits: No acute finding. Other: None. CT CERVICAL SPINE  FINDINGS Alignment: Normal. Skull base and vertebrae: No acute fracture. No primary bone lesion or focal pathologic process. Soft tissues and spinal canal: No prevertebral fluid or swelling. No visible canal hematoma. Disc levels: Degenerative joint changes with narrowed joint space and osteophyte formation identified mid to lower cervical spine. Upper chest: Negative. Other: None. IMPRESSION: 1. No focal acute intracranial abnormality identified. 2. Chronic diffuse atrophy and chronic bilateral periventricular white matter small vessel ischemic change. 3. No acute fracture or dislocation of cervical spine. 4. Degenerative joint changes of cervical spine. Electronically Signed   By: Sherian ReinWei-Chen  Lin M.D.   On: 02/23/2020 08:44   CT Cervical Spine Wo Contrast  Result Date: 02/23/2020 CLINICAL DATA:  Neck trauma. EXAM: CT HEAD WITHOUT CONTRAST CT CERVICAL SPINE WITHOUT CONTRAST TECHNIQUE: Multidetector CT imaging of the head and cervical spine was performed following the standard protocol without intravenous contrast. Multiplanar CT image reconstructions of the cervical spine were also generated. COMPARISON:  None. FINDINGS: CT HEAD FINDINGS Brain: No evidence of acute infarction, hemorrhage, hydrocephalus, extra-axial collection or mass lesion/mass effect. There is chronic diffuse atrophy. Chronic bilateral periventricular white matter small vessel ischemic changes noted. Vascular: No hyperdense vessel is noted. Skull: Normal. Negative for fracture or focal lesion. Sinuses/Orbits: No acute finding. Other: None. CT CERVICAL SPINE FINDINGS Alignment: Normal. Skull base and vertebrae: No acute fracture. No primary bone lesion or focal pathologic process. Soft tissues and spinal canal: No prevertebral fluid or swelling. No visible canal hematoma. Disc levels: Degenerative joint changes with narrowed joint space and osteophyte formation identified mid to lower cervical spine. Upper chest: Negative. Other: None.  IMPRESSION: 1. No focal acute intracranial abnormality identified. 2. Chronic diffuse atrophy and chronic bilateral periventricular white matter small vessel ischemic change. 3. No acute fracture or dislocation of cervical spine. 4. Degenerative joint changes of cervical spine. Electronically Signed   By: Sherian ReinWei-Chen  Lin M.D.   On: 02/23/2020 08:44    Procedures Procedures (including critical care time)  Medications  Ordered in ED Medications  sodium chloride 0.9 % bolus 500 mL (500 mLs Intravenous New Bag/Given 02/23/20 0945)    ED Course  I have reviewed the triage vital signs and the nursing notes.  Pertinent labs & imaging results that were available during my care of the patient were reviewed by me and considered in my medical decision making (see chart for details).  Clinical Course as of 02/23/20 1309  Sun Feb 23, 2020  0917 Hemoglobin(!): 7.8 [CA]  0917 Glucose(!): 330 [CA]  0917 Creatinine(!): 1.31 [CA]  0917 BUN(!): 26 [CA]  0917 Platelets(!): 500 [CA]  0927 Fecal Occult Blood, POC: NEGATIVE [CA]  1134 CK Total: 130 [CA]    Clinical Course User Index [CA] Mannie Stabile, PA-C   MDM Rules/Calculators/A&P                         70 year old female presents to the ED due to generalized weakness and fatigue over the past 2 weeks.  Discussed case with patient's niece who found patient on the floor in which she had been lying there for roughly 24 hours.  During my initial evaluation, patient has no real complaints and states she was just trying to sleep on the floor because her bed was filled with magazines.  Denies chest pain and shortness of breath.  Patient in no acute distress and nontoxic-appearing.  Routine labs ordered at triage.  UA, CK, hepatic function panel, lipase added due to generalized abdominal tenderness on exam.  CBC significant for significant anemia with hemoglobin at 7.8 and thrombocytosis at 500.  Patient has a history of anemia however, no recent labs to  see baseline hemoglobin.  5 years ago hemoglobin at 9.5.  Fecal occult negative.  Patient denies hematemesis, hematochezia, melena.  BMP significant for hyperglycemia at 330 with no anion gap.  Elevated creatinine and BUN.  Per chart review, patient has a history of CKD however, no labs to establish baseline.  500 mL IV fluids given. Suspect symptoms possibly related to symptomatic anemia. Anemia panel ordered. Patient will most likely need admission for symptomatic anemia and failure to thrive. COVID test ordered. Discussed case with Dr. Adela Lank who evaluated patient at bedside and agrees with assessment and plan.   CT head and cervical spine personally reviewed which is negative for any acute abnormalities. UA significant for small leukocytes and many bacteria. No nitrites. Patient denies any urinary symptoms. Urine culture pending. CK normal.   Attempted to ambulate patient and she had a very hard time ambulating with her cane. Will consult hospitalist for admission.  Discussed case with Dr. Debby Bud with [facility] who agrees to admit patient for further treatment. Final Clinical Impression(s) / ED Diagnoses Final diagnoses:  Generalized weakness  Symptomatic anemia    Rx / DC Orders ED Discharge Orders         Ordered    Type and screen  Status:  Canceled       Comments: [facility]    02/23/20 1245           Jesusita Oka 02/23/20 1311    Melene Plan, DO 02/23/20 1318

## 2020-02-23 NOTE — ED Triage Notes (Signed)
Ems said upon there arrival pt was sitting in her chair and told them she could not move her legs and that she had been having weakness for about 2 weeks. Pt was sitting in feces and trash was all over the patients room. Pt has pretty much carried to the stretcher per ems she was very weak in her lower legs.

## 2020-02-24 ENCOUNTER — Observation Stay (HOSPITAL_COMMUNITY): Payer: Medicare Other

## 2020-02-24 DIAGNOSIS — D509 Iron deficiency anemia, unspecified: Secondary | ICD-10-CM | POA: Diagnosis present

## 2020-02-24 DIAGNOSIS — R32 Unspecified urinary incontinence: Secondary | ICD-10-CM | POA: Diagnosis present

## 2020-02-24 DIAGNOSIS — Z7984 Long term (current) use of oral hypoglycemic drugs: Secondary | ICD-10-CM | POA: Diagnosis not present

## 2020-02-24 DIAGNOSIS — D75839 Thrombocytosis, unspecified: Secondary | ICD-10-CM | POA: Diagnosis present

## 2020-02-24 DIAGNOSIS — Y92003 Bedroom of unspecified non-institutional (private) residence as the place of occurrence of the external cause: Secondary | ICD-10-CM | POA: Diagnosis not present

## 2020-02-24 DIAGNOSIS — R531 Weakness: Secondary | ICD-10-CM

## 2020-02-24 DIAGNOSIS — I5030 Unspecified diastolic (congestive) heart failure: Secondary | ICD-10-CM

## 2020-02-24 DIAGNOSIS — E1165 Type 2 diabetes mellitus with hyperglycemia: Secondary | ICD-10-CM | POA: Diagnosis present

## 2020-02-24 DIAGNOSIS — E860 Dehydration: Secondary | ICD-10-CM | POA: Diagnosis present

## 2020-02-24 DIAGNOSIS — I5042 Chronic combined systolic (congestive) and diastolic (congestive) heart failure: Secondary | ICD-10-CM | POA: Diagnosis present

## 2020-02-24 DIAGNOSIS — N179 Acute kidney failure, unspecified: Secondary | ICD-10-CM | POA: Diagnosis present

## 2020-02-24 DIAGNOSIS — I6330 Cerebral infarction due to thrombosis of unspecified cerebral artery: Secondary | ICD-10-CM | POA: Diagnosis not present

## 2020-02-24 DIAGNOSIS — I5032 Chronic diastolic (congestive) heart failure: Secondary | ICD-10-CM | POA: Diagnosis not present

## 2020-02-24 DIAGNOSIS — G9341 Metabolic encephalopathy: Secondary | ICD-10-CM | POA: Diagnosis present

## 2020-02-24 DIAGNOSIS — W19XXXA Unspecified fall, initial encounter: Secondary | ICD-10-CM | POA: Diagnosis present

## 2020-02-24 DIAGNOSIS — U071 COVID-19: Secondary | ICD-10-CM

## 2020-02-24 DIAGNOSIS — Z794 Long term (current) use of insulin: Secondary | ICD-10-CM | POA: Diagnosis not present

## 2020-02-24 DIAGNOSIS — I6381 Other cerebral infarction due to occlusion or stenosis of small artery: Secondary | ICD-10-CM | POA: Diagnosis present

## 2020-02-24 DIAGNOSIS — I110 Hypertensive heart disease with heart failure: Secondary | ICD-10-CM | POA: Diagnosis present

## 2020-02-24 DIAGNOSIS — I671 Cerebral aneurysm, nonruptured: Secondary | ICD-10-CM | POA: Diagnosis present

## 2020-02-24 DIAGNOSIS — Z79899 Other long term (current) drug therapy: Secondary | ICD-10-CM | POA: Diagnosis not present

## 2020-02-24 DIAGNOSIS — R627 Adult failure to thrive: Secondary | ICD-10-CM | POA: Diagnosis present

## 2020-02-24 DIAGNOSIS — R3915 Urgency of urination: Secondary | ICD-10-CM | POA: Diagnosis present

## 2020-02-24 DIAGNOSIS — D649 Anemia, unspecified: Secondary | ICD-10-CM | POA: Diagnosis not present

## 2020-02-24 DIAGNOSIS — Z87820 Personal history of traumatic brain injury: Secondary | ICD-10-CM | POA: Diagnosis not present

## 2020-02-24 DIAGNOSIS — I429 Cardiomyopathy, unspecified: Secondary | ICD-10-CM | POA: Diagnosis present

## 2020-02-24 DIAGNOSIS — Z6829 Body mass index (BMI) 29.0-29.9, adult: Secondary | ICD-10-CM | POA: Diagnosis not present

## 2020-02-24 DIAGNOSIS — Z8249 Family history of ischemic heart disease and other diseases of the circulatory system: Secondary | ICD-10-CM | POA: Diagnosis not present

## 2020-02-24 DIAGNOSIS — N390 Urinary tract infection, site not specified: Secondary | ICD-10-CM | POA: Diagnosis present

## 2020-02-24 LAB — BASIC METABOLIC PANEL
Anion gap: 9 (ref 5–15)
Anion gap: 10 (ref 5–15)
BUN: 23 mg/dL (ref 8–23)
BUN: 21 mg/dL (ref 8–23)
CO2: 24 mmol/L (ref 22–32)
CO2: 22 mmol/L (ref 22–32)
Calcium: 8.4 mg/dL — ABNORMAL LOW (ref 8.9–10.3)
Calcium: 8.1 mg/dL — ABNORMAL LOW (ref 8.9–10.3)
Chloride: 102 mmol/L (ref 98–111)
Chloride: 103 mmol/L (ref 98–111)
Creatinine, Ser: 1.1 mg/dL — ABNORMAL HIGH (ref 0.44–1.00)
Creatinine, Ser: 0.99 mg/dL (ref 0.44–1.00)
GFR, Estimated: 54 mL/min — ABNORMAL LOW (ref >60)
GFR, Estimated: >60 mL/min (ref >60)
Glucose, Bld: 245 mg/dL — ABNORMAL HIGH (ref 70–99)
Glucose, Bld: 119 mg/dL — ABNORMAL HIGH (ref 70–99)
Potassium: 4.3 mmol/L (ref 3.5–5.1)
Potassium: 4 mmol/L (ref 3.5–5.1)
Sodium: 135 mmol/L (ref 135–145)
Sodium: 135 mmol/L (ref 135–145)

## 2020-02-24 LAB — CBC
HCT: 26.1 % — ABNORMAL LOW (ref 36.0–46.0)
HCT: 23.5 % — ABNORMAL LOW (ref 36.0–46.0)
Hemoglobin: 8.2 g/dL — ABNORMAL LOW (ref 12.0–15.0)
Hemoglobin: 7.3 g/dL — ABNORMAL LOW (ref 12.0–15.0)
MCH: 22.7 pg — ABNORMAL LOW (ref 26.0–34.0)
MCH: 22.5 pg — ABNORMAL LOW (ref 26.0–34.0)
MCHC: 31.4 g/dL (ref 30.0–36.0)
MCHC: 31.1 g/dL (ref 30.0–36.0)
MCV: 72.3 fL — ABNORMAL LOW (ref 80.0–100.0)
MCV: 72.5 fL — ABNORMAL LOW (ref 80.0–100.0)
Platelets: 507 10*3/uL — ABNORMAL HIGH (ref 150–400)
Platelets: 461 10*3/uL — ABNORMAL HIGH (ref 150–400)
RBC: 3.61 MIL/uL — ABNORMAL LOW (ref 3.87–5.11)
RBC: 3.24 MIL/uL — ABNORMAL LOW (ref 3.87–5.11)
RDW: 19.9 % — ABNORMAL HIGH (ref 11.5–15.5)
RDW: 20.3 % — ABNORMAL HIGH (ref 11.5–15.5)
WBC: 9.3 10*3/uL (ref 4.0–10.5)
WBC: 8.5 10*3/uL (ref 4.0–10.5)
nRBC: 0.2 % (ref 0.0–0.2)
nRBC: 0.2 % (ref 0.0–0.2)

## 2020-02-24 LAB — URINE CULTURE: Culture: NO GROWTH

## 2020-02-24 LAB — ECHOCARDIOGRAM COMPLETE
Area-P 1/2: 4.89 cm2
Calc EF: 38.4 %
P 1/2 time: 696 msec
S' Lateral: 3.35 cm
Single Plane A2C EF: 41 %
Single Plane A4C EF: 34.9 %

## 2020-02-24 LAB — CBG MONITORING, ED
Glucose-Capillary: 125 mg/dL — ABNORMAL HIGH (ref 70–99)
Glucose-Capillary: 221 mg/dL — ABNORMAL HIGH (ref 70–99)
Glucose-Capillary: 240 mg/dL — ABNORMAL HIGH (ref 70–99)

## 2020-02-24 LAB — D-DIMER, QUANTITATIVE: D-Dimer, Quant: 1.89 ug/mL-FEU — ABNORMAL HIGH (ref 0.00–0.50)

## 2020-02-24 LAB — GLUCOSE, CAPILLARY
Glucose-Capillary: 216 mg/dL — ABNORMAL HIGH (ref 70–99)
Glucose-Capillary: 171 mg/dL — ABNORMAL HIGH (ref 70–99)
Glucose-Capillary: 117 mg/dL — ABNORMAL HIGH (ref 70–99)

## 2020-02-24 LAB — C-REACTIVE PROTEIN: CRP: 14.1 mg/dL — ABNORMAL HIGH (ref <1.0)

## 2020-02-24 LAB — PROCALCITONIN: Procalcitonin: <0.1 ng/mL

## 2020-02-24 MED ORDER — HYDROCOD POLST-CPM POLST ER 10-8 MG/5ML PO SUER: 5.0000 mL | Freq: Two times a day (BID) | ORAL | Status: DC | PRN

## 2020-02-24 MED ORDER — IPRATROPIUM-ALBUTEROL 20-100 MCG/ACT IN AERS
1.0000 | INHALATION_SPRAY | Freq: Four times a day (QID) | RESPIRATORY_TRACT | Status: DC
  Administered 2020-02-25 (×3): 1 via RESPIRATORY_TRACT
  Filled 2020-02-24: qty 4

## 2020-02-24 MED ORDER — ACETAMINOPHEN 325 MG PO TABS
650.0000 mg | ORAL_TABLET | ORAL | Status: DC | PRN
  Administered 2020-02-24 – 2020-03-10 (×4): 650 mg via ORAL
  Filled 2020-02-24 (×4): qty 2

## 2020-02-24 MED ORDER — SODIUM CHLORIDE 0.9 % IV SOLN
100.0000 mg | Freq: Every day | INTRAVENOUS | Status: AC
  Administered 2020-02-25 – 2020-02-26 (×2): 100 mg via INTRAVENOUS
  Filled 2020-02-24 (×2): qty 20

## 2020-02-24 MED ORDER — ASCORBIC ACID 500 MG PO TABS
500.0000 mg | ORAL_TABLET | Freq: Every day | ORAL | Status: DC
  Administered 2020-02-24 – 2020-03-10 (×16): 500 mg via ORAL
  Filled 2020-02-24 (×16): qty 1

## 2020-02-24 MED ORDER — GUAIFENESIN-DM 100-10 MG/5ML PO SYRP: 10.0000 mL | ORAL_SOLUTION | ORAL | Status: DC | PRN

## 2020-02-24 MED ORDER — SODIUM CHLORIDE 0.9 % IV SOLN
200.0000 mg | Freq: Once | INTRAVENOUS | Status: AC
  Administered 2020-02-24: 200 mg via INTRAVENOUS
  Filled 2020-02-24: qty 200
  Filled 2020-02-24 (×2): qty 40

## 2020-02-24 MED ORDER — ZINC SULFATE 220 (50 ZN) MG PO CAPS
220.0000 mg | ORAL_CAPSULE | Freq: Every day | ORAL | Status: DC
  Administered 2020-02-24 – 2020-03-10 (×16): 220 mg via ORAL
  Filled 2020-02-24 (×16): qty 1

## 2020-02-24 NOTE — Evaluation (Signed)
Occupational Therapy Evaluation Patient Details Name: Ruth Reynolds MRN: [id number] DOB: [birth year] Today's Date: 02/24/2020    History of Present Illness Pt is a 70 y/o female admitted after falling. Found to be COVID +. PMH includes DM, anemia, CHF, and SDH.   Clinical Impression   PTA, pt was living with her niece and was independent with BADLs and using a SPC for functional mobility. Pt currently requiring Min A for UB ADLs, Mod-Max A for LB ADLs, and Min-Mod A for functional mobility. Pt presenting with poor cognition, balance, strength, and activity tolerance impacting her safety, functional performance, and fall risk. Pt requiring Mod-Max cues throughout due to poor attention, awareness, sequencing, and memory. Pt also with urinary incontinence. Pt would benefit from further acute OT to facilitate safe dc. Recommend dc to SNF for further OT to optimize safety, independence with ADLs, and return to PLOF.     Follow Up Recommendations  SNF    Equipment Recommendations  3 in 1 bedside commode;Other (comment) (Defer to next venue)    Recommendations for Other Services PT consult     Precautions / Restrictions Precautions Precautions: Fall Restrictions Weight Bearing Restrictions: No      Mobility Bed Mobility Overal bed mobility: Needs Assistance Bed Mobility: Supine to Sit;Sit to Supine     Supine to sit: Min assist Sit to supine: Mod assist   General bed mobility comments: Min A to bring trunk into upright posture. Mod A for managing BLEs over EOB    Transfers Overall transfer level: Needs assistance Equipment used: Straight cane Transfers: Sit to/from Stand Sit to Stand: Min assist         General transfer comment: Min A to power up and then gain balance in standing    Balance Overall balance assessment: Needs assistance Sitting-balance support: No upper extremity supported;Feet supported Sitting balance-Leahy Scale: Fair     Standing balance support:  Bilateral upper extremity supported;During functional activity Standing balance-Leahy Scale: Poor Standing balance comment: Reliant on BUE and external support                           ADL either performed or assessed with clinical judgement   ADL Overall ADL's : Needs assistance/impaired Eating/Feeding: Set up;Supervision/ safety;Sitting   Grooming: Wash/dry hands;Set up;Supervision/safety;Sitting Grooming Details (indicate cue type and reason): Providing pt with hand sanitizer and then requiring Min cues for sequencing to rub hands together and apply sanitizer Upper Body Bathing: Minimal assistance;Sitting   Lower Body Bathing: Minimal assistance;Moderate assistance;Sit to/from stand   Upper Body Dressing : Minimal assistance;Sitting   Lower Body Dressing: Maximal assistance;Sit to/from stand Lower Body Dressing Details (indicate cue type and reason): Max A to doff socks and don new ones. Toilet Transfer: Moderate assistance;Cueing for sequencing;Ambulation;BSC Toilet Transfer Details (indicate cue type and reason): Requiring Max cues for sitting at [facility].         Functional mobility during ADLs: Minimal assistance;Moderate assistance;Cane General ADL Comments: Pt with poor cognition, balance, strength, and acitvity tolerance. Pt at high risk to fall. Poor occuaptional performance and participation     Vision         Perception     Praxis      Pertinent Vitals/Pain Pain Assessment: Faces Faces Pain Scale: No hurt Pain Intervention(s): Monitored during session     Hand Dominance Right   Extremity/Trunk Assessment Upper Extremity Assessment Upper Extremity Assessment: Generalized weakness   Lower Extremity Assessment Lower Extremity Assessment: Defer  to PT evaluation   Cervical / Trunk Assessment Cervical / Trunk Assessment: Kyphotic   Communication Communication Communication: No difficulties   Cognition Arousal/Alertness: Awake/alert Behavior  During Therapy: Flat affect Overall Cognitive Status: No family/caregiver present to determine baseline cognitive functioning Area of Impairment: Attention;Following commands;Awareness;Problem solving                   Current Attention Level: Focused   Following Commands: Follows one step commands inconsistently;Follows one step commands with increased time   Awareness: Intellectual Problem Solving: Slow processing;Difficulty sequencing;Requires verbal cues General Comments: Pt highly internally distractable. Requiring cues throughout for following commands and sequencing. ST memory deficits requiring cues to recall task being performed such as repeated cue of going to sink, then to sit at [facility], and then return to bed. Pt forgetting task within 10-30 secs. Pt also with poor awareness of deficits. When asking ot if her pregnant niece could assist physically like therapist did, she replied "probably and if not her daughter can, she is a big girl." (pt's grand niece is 7)   General Comments  HR 90-110s. SpO2 90 on RA; however, pleth poor for majority of session    Exercises     Shoulder Instructions      Home Living Family/patient expects to be discharged to:: Private residence Living Arrangements: Other (Comment) (niece (who is pregnant) and 75 yo grand niece) Available Help at Discharge: Family;Available 24 hours/day Type of Home: House Home Access: Level entry     Home Layout: One level     Bathroom Shower/Tub: Chief Strategy Officer: Standard     Home Equipment: Cane - single point          Prior Functioning/Environment Level of Independence: Independent with assistive device(s)        Comments: USes cane for mobility        OT Problem List: Decreased strength;Decreased activity tolerance;Decreased range of motion;Impaired balance (sitting and/or standing);Decreased knowledge of use of DME or AE;Decreased safety awareness;Decreased  cognition;Decreased knowledge of precautions      OT Treatment/Interventions: Self-care/ADL training;Therapeutic exercise;Energy conservation;DME and/or AE instruction;Therapeutic activities;Patient/family education    OT Goals(Current goals can be found in the care plan section) Acute Rehab OT Goals Patient Stated Goal: none stated OT Goal Formulation: With patient Time For Goal Achievement: 03/09/20 Potential to Achieve Goals: Good  OT Frequency: Min 2X/week   Barriers to D/C:            Co-evaluation              AM-PAC OT "6 Clicks" Daily Activity     Outcome Measure Help from another person eating meals?: None Help from another person taking care of personal grooming?: A Little Help from another person toileting, which includes using toliet, bedpan, or urinal?: A Lot Help from another person bathing (including washing, rinsing, drying)?: A Lot Help from another person to put on and taking off regular upper body clothing?: A Little Help from another person to put on and taking off regular lower body clothing?: A Lot 6 Click Score: 16   End of Session Equipment Utilized During Treatment: Other (comment) [facility]) Nurse Communication: Mobility status  Activity Tolerance: Patient limited by fatigue Patient left: in bed;with call bell/phone within reach  OT Visit Diagnosis: Unsteadiness on feet (R26.81);Other abnormalities of gait and mobility (R26.89);Muscle weakness (generalized) (M62.81)                Time: 6378-5885 OT Time Calculation (min):  36 min Charges:  OT General Charges $OT Visit: 1 Visit OT Evaluation $OT Eval Moderate Complexity: 1 Mod OT Treatments $Self Care/Home Management : 8-22 mins  Maisee Vollman MSOT, OTR/L Acute Rehab Pager: [phone] Office: [phone]  Theodoro Grist Iyani Dresner 02/24/2020, 3:12 PM

## 2020-02-24 NOTE — Progress Notes (Signed)
PROGRESS NOTE    Ruth Reynolds  NGE:[id number] DOB: 01/07/1951 DOA: 02/23/2020 PCP: Patient, No Pcp Per   Brief Narrative: Taken from H&P Ruth Reynolds is a 70 y.o. female with medical history significant of DM2, chronic anemia, h/o SDH after fall, HFpEF. She presents to the ED via EMS after being found on the floor. Patient states she was sleeping on the floor because her bed was filled with magazines and she felt like that was the best place to fall asleep. Spoke to patient's niece who notes that patient fell yesterday and has been on the floor for roughly 24 hours. She currently lives with her niece who notes she attempted to help patient up however, patient did not want any help and continued tolay on the floor for roughly 1 day. Niece reports generalized weakness over the past few weeks with increased fatigue. He is also notes that patient has been urinating on herself recently due to the inability to get to the bathroom quick enough. Over the past few months she has been ambulating with a cane. PT is recommending SNF placement.  Subjective: Patient continued to feel weak, no other complaint.  Assessment & Plan:   Active Problems:   Anemia   Hyperglycemia due to diabetes mellitus (HCC)   Acute prerenal azotemia   Acute metabolic encephalopathy   Acute lower UTI   (HFpEF) heart failure with preserved ejection fraction (HCC)  COVID-19 infection.  Chest x-ray with some concern of infiltrate, saturating well on room air.  Elevated inflammatory markers, procalcitonin negative. -Start her on remdesivir due to her risk factors-for 3 days. -Monitor inflammatory markers. -Supportive care and supplement.  UTI.  She was started on Cipro for concern of UTI.  Urine culture negative. -Discontinue antibiotics and monitor.  Fall/generalized weakness. -PT is recommending SNF placement -TOC consult  Type 2 diabetes mellitus.  Hyperglycemia with elevated CBG at 9.8.  Patient was not taking her home  dose of Metformin. -Start her on Tradjenta. -Add Lantus 10 units -Continue with sliding scale.  AKI.  Resolved with IV hydration  Acute metabolic encephalopathy.  Patient was able to answer questions appropriately.  Appears at her baseline now.  Iron deficiency anemia.  Hemoglobin stable around 8. Anemia panel with low iron, ferritin borderline, B12 337. -Start her on iron supplement -Continue to monitor -Transfuse if below 7  HFrEF.  Prior echocardiogram done in October 2016 with normal EF and grade 2 diastolic dysfunction.  Appears euvolemic.  BNP within normal limit. -Repeat echocardiogram with EF of 45 to 50% and grade 1 diastolic dysfunction.   -Monitor volume status  Objective: Vitals:   02/24/20 1245 02/24/20 1315 02/24/20 1455 02/24/20 1542  BP: 137/70  (!) 172/69 130/65  Pulse: 80 81 (!) 104 75  Resp: 14 12 (!) 21 16  Temp:   98.8 F (37.1 C) 98.5 F (36.9 C)  TempSrc:   Oral   SpO2: 100% 100% 97% 100%   No intake or output data in the 24 hours ending 02/24/20 1832 There were no vitals filed for this visit.  Examination:  General exam: Appears calm and comfortable  Respiratory system: Clear to auscultation. Respiratory effort normal. Cardiovascular system: S1 & S2 heard, RRR.  Gastrointestinal system: Soft, nontender, nondistended, bowel sounds positive. Central nervous system: Alert and oriented. No focal neurological deficits. Extremities: No edema, no cyanosis, pulses intact and symmetrical. Psychiatry: Judgement and insight appear normal.    DVT prophylaxis: Lovenox Code Status: Full Family Communication: Discussed with niece on phone.  Disposition Plan:  Status is: Inpatient  Remains inpatient appropriate because:Inpatient level of care appropriate due to severity of illness   Dispo: The patient is from: Home              Anticipated d/c is to: SNF              Anticipated d/c date is: 1 day              Patient currently is not medically stable  to d/c.   Difficult to place patient No   Consultants:   None  Procedures:  Antimicrobials:   Data Reviewed: I have personally reviewed following labs and imaging studies  CBC: Recent Labs  Lab 02/23/20 0255 02/24/20 0135 02/24/20 0524  WBC 8.1 9.3 8.5  HGB 7.8* 8.2* 7.3*  HCT 25.0* 26.1* 23.5*  MCV 72.7* 72.3* 72.5*  PLT 500* 507* 461*   Basic Metabolic Panel: Recent Labs  Lab 02/23/20 0255 02/24/20 0135 02/24/20 0524  NA 135 135 135  K 4.2 4.3 4.0  CL 99 102 103  CO2 22 24 22   GLUCOSE 330* 245* 119*  BUN 26* 23 21  CREATININE 1.31* 1.10* 0.99  CALCIUM 8.6* 8.4* 8.1*   GFR: CrCl cannot be calculated (Unknown ideal weight.). Liver Function Tests: Recent Labs  Lab 02/23/20 0954  AST 18  ALT 11  ALKPHOS 72  BILITOT 0.5  PROT 8.2*  ALBUMIN 2.9*   Recent Labs  Lab 02/23/20 0954  LIPASE 27   No results for input(s): AMMONIA in the last 168 hours. Coagulation Profile: No results for input(s): INR, PROTIME in the last 168 hours. Cardiac Enzymes: Recent Labs  Lab 02/23/20 0954  CKTOTAL 130   BNP (last 3 results) No results for input(s): PROBNP in the last 8760 hours. HbA1C: Recent Labs    02/23/20 1510  HGBA1C 9.8*   CBG: Recent Labs  Lab 02/24/20 0051 02/24/20 0849 02/24/20 1454 02/24/20 1542 02/24/20 1753  GLUCAP 240* 125* 221* 216* 171*   Lipid Profile: No results for input(s): CHOL, HDL, LDLCALC, TRIG, CHOLHDL, LDLDIRECT in the last 72 hours. Thyroid Function Tests: Recent Labs    02/23/20 1510  TSH 0.515   Anemia Panel: Recent Labs    02/23/20 0954 02/23/20 1510  VITAMINB12 316 337  FOLATE 36.6 33.8  FERRITIN 18 19  TIBC 368 368  IRON 15* 13*  RETICCTPCT 0.7 0.6   Sepsis Labs: Recent Labs  Lab 02/24/20 1556  PROCALCITON <0.10    Recent Results (from the past 240 hour(s))  SARS CORONAVIRUS 2 (TAT 6-24 HRS) Nasopharyngeal Nasopharyngeal Swab     Status: Abnormal   Collection Time: 02/23/20 11:48 AM    Specimen: Nasopharyngeal Swab  Result Value Ref Range Status   SARS Coronavirus 2 POSITIVE (A) NEGATIVE Final    Comment: (NOTE) SARS-CoV-2 target nucleic acids are DETECTED.  The SARS-CoV-2 RNA is generally detectable in upper and lower respiratory specimens during the acute phase of infection. Positive results are indicative of the presence of SARS-CoV-2 RNA. Clinical correlation with patient history and other diagnostic information is  necessary to determine patient infection status. Positive results do not rule out bacterial infection or co-infection with other viruses.  The expected result is Negative.  Fact Sheet for Patients: HairSlick.no  Fact Sheet for Healthcare Providers: quierodirigir.com  This test is not yet approved or cleared by the [place] FDA and  has been authorized for detection and/or diagnosis of SARS-CoV-2 by FDA under an Emergency Use  Authorization (EUA). This EUA will remain  in effect (meaning this test can be used) for the duration of the COVID-19 declaration under Section 564(b)(1) of the Act, 21 U. S.C. section 360bbb-3(b)(1), unless the authorization is terminated or revoked sooner.   Performed at [facility], [address] [place], [place] [address]   Urine culture     Status: None   Collection Time: 02/23/20  3:10 PM   Specimen: Urine, Catheterized  Result Value Ref Range Status   Specimen Description URINE, CATHETERIZED  Final   Special Requests NONE  Final   Culture   Final    NO GROWTH Performed at [facility], [address] [place], [place] [address]    Report Status 02/24/2020 FINAL  Final     Radiology Studies: CT Head Wo Contrast  Result Date: 02/23/2020 CLINICAL DATA:  Neck trauma. EXAM: CT HEAD WITHOUT CONTRAST CT CERVICAL SPINE WITHOUT CONTRAST TECHNIQUE: Multidetector CT imaging of the head and cervical spine was performed following the standard  protocol without intravenous contrast. Multiplanar CT image reconstructions of the cervical spine were also generated. COMPARISON:  None. FINDINGS: CT HEAD FINDINGS Brain: No evidence of acute infarction, hemorrhage, hydrocephalus, extra-axial collection or mass lesion/mass effect. There is chronic diffuse atrophy. Chronic bilateral periventricular white matter small vessel ischemic changes noted. Vascular: No hyperdense vessel is noted. Skull: Normal. Negative for fracture or focal lesion. Sinuses/Orbits: No acute finding. Other: None. CT CERVICAL SPINE FINDINGS Alignment: Normal. Skull base and vertebrae: No acute fracture. No primary bone lesion or focal pathologic process. Soft tissues and spinal canal: No prevertebral fluid or swelling. No visible canal hematoma. Disc levels: Degenerative joint changes with narrowed joint space and osteophyte formation identified mid to lower cervical spine. Upper chest: Negative. Other: None. IMPRESSION: 1. No focal acute intracranial abnormality identified. 2. Chronic diffuse atrophy and chronic bilateral periventricular white matter small vessel ischemic change. 3. No acute fracture or dislocation of cervical spine. 4. Degenerative joint changes of cervical spine. Electronically Signed   By: Sherian ReinWei-Chen  Lin M.D.   On: 02/23/2020 08:44   CT Cervical Spine Wo Contrast  Result Date: 02/23/2020 CLINICAL DATA:  Neck trauma. EXAM: CT HEAD WITHOUT CONTRAST CT CERVICAL SPINE WITHOUT CONTRAST TECHNIQUE: Multidetector CT imaging of the head and cervical spine was performed following the standard protocol without intravenous contrast. Multiplanar CT image reconstructions of the cervical spine were also generated. COMPARISON:  None. FINDINGS: CT HEAD FINDINGS Brain: No evidence of acute infarction, hemorrhage, hydrocephalus, extra-axial collection or mass lesion/mass effect. There is chronic diffuse atrophy. Chronic bilateral periventricular white matter small vessel ischemic changes  noted. Vascular: No hyperdense vessel is noted. Skull: Normal. Negative for fracture or focal lesion. Sinuses/Orbits: No acute finding. Other: None. CT CERVICAL SPINE FINDINGS Alignment: Normal. Skull base and vertebrae: No acute fracture. No primary bone lesion or focal pathologic process. Soft tissues and spinal canal: No prevertebral fluid or swelling. No visible canal hematoma. Disc levels: Degenerative joint changes with narrowed joint space and osteophyte formation identified mid to lower cervical spine. Upper chest: Negative. Other: None. IMPRESSION: 1. No focal acute intracranial abnormality identified. 2. Chronic diffuse atrophy and chronic bilateral periventricular white matter small vessel ischemic change. 3. No acute fracture or dislocation of cervical spine. 4. Degenerative joint changes of cervical spine. Electronically Signed   By: Sherian ReinWei-Chen  Lin M.D.   On: 02/23/2020 08:44   DG Chest Port 1 View  Result Date: 02/24/2020 CLINICAL DATA:  COVID-19 positive.  Weakness.  EXAM: PORTABLE CHEST 1 VIEW COMPARISON:  None. FINDINGS: There is a subtle area of airspace opacity in the lateral right base region. Lungs elsewhere clear. Heart is borderline enlarged with pulmonary vascularity normal. No adenopathy. There is aortic atherosclerosis. No bone lesions. IMPRESSION: Subtle airspace opacity lateral right base concerning for focus of developing pneumonia. Atypical organism pneumonia could present in this manner. Lungs otherwise clear. Heart borderline enlarged. Aortic Atherosclerosis (ICD10-I70.0). Electronically Signed   By: Bretta Bang III M.D.   On: 02/24/2020 10:31   ECHOCARDIOGRAM COMPLETE  Result Date: 02/24/2020    ECHOCARDIOGRAM REPORT   Patient Name:   Ruth Reynolds Date of Exam: 02/24/2020 Medical Rec #:  [id number]   Height:       66.0 in Accession #:    [id number]  Weight:       180.0 lb Date of Birth:  September 29, 1950    BSA:          1.912 m Patient Age:    69 years    BP:           122/62 mmHg  Patient Gender: F           HR:           85 bpm. Exam Location:  Inpatient Procedure: 2D Echo, 3D Echo, Cardiac Doppler and Color Doppler Indications:    I50.9* Heart failure (unspecified)  History:        Patient has prior history of Echocardiogram examinations, most                 recent 11/12/2018. Stroke, Signs/Symptoms:Syncope; Risk                 Factors:Diabetes. Covid positive.  Sonographer:    Sheralyn Boatman RDCS Referring Phys: 19 MICHAEL E NORINS  Sonographer Comments: Technically difficult study due to poor echo windows. IMPRESSIONS  1. Left ventricular ejection fraction, by estimation, is 45 to 50%. The left ventricle has mildly decreased function. The left ventricle demonstrates global hypokinesis. There is moderate concentric left ventricular hypertrophy. Left ventricular diastolic parameters are consistent with Grade I diastolic dysfunction (impaired relaxation).  2. Right ventricular systolic function is normal. The right ventricular size is normal. There is normal pulmonary artery systolic pressure.  3. The mitral valve is degenerative. Mild mitral valve regurgitation.  4. The aortic valve is tricuspid. Aortic valve regurgitation is mild.  5. Aortic dilatation noted. There is mild dilatation of the ascending aorta, measuring 38 mm.  6. The inferior vena cava is normal in size with greater than 50% respiratory variability, suggesting right atrial pressure of 3 mmHg. Comparison(s): Compared to prior echo report (images would not load) in 2016, the LVEF is now mildly reduced at 45-50%. FINDINGS  Left Ventricle: Left ventricular ejection fraction, by estimation, is 45 to 50%. The left ventricle has mildly decreased function. The left ventricle demonstrates global hypokinesis. The left ventricular internal cavity size was normal in size. There is  moderate concentric left ventricular hypertrophy. Left ventricular diastolic parameters are consistent with Grade I diastolic dysfunction (impaired  relaxation). Right Ventricle: The right ventricular size is normal. No increase in right ventricular wall thickness. Right ventricular systolic function is normal. There is normal pulmonary artery systolic pressure. The tricuspid regurgitant velocity is 2.57 m/s, and  with an assumed right atrial pressure of 8 mmHg, the estimated right ventricular systolic pressure is 34.4 mmHg. Left Atrium: Left atrial size was normal in size. Right Atrium: Right atrial size was normal in size.  Pericardium: There is no evidence of pericardial effusion. Mitral Valve: The mitral valve is degenerative in appearance. There is mild thickening of the mitral valve leaflet(s). There is mild calcification of the mitral valve leaflet(s). Mild mitral annular calcification. Mild mitral valve regurgitation. Tricuspid Valve: The tricuspid valve is normal in structure. Tricuspid valve regurgitation is mild. Aortic Valve: The aortic valve is tricuspid. Aortic valve regurgitation is mild. Aortic regurgitation PHT measures 696 msec. Pulmonic Valve: The pulmonic valve was normal in structure. Pulmonic valve regurgitation is mild. Aorta: Aortic dilatation noted. There is mild dilatation of the ascending aorta, measuring 38 mm. Venous: The inferior vena cava is normal in size with greater than 50% respiratory variability, suggesting right atrial pressure of 3 mmHg. IAS/Shunts: No atrial level shunt detected by color flow Doppler.  LEFT VENTRICLE PLAX 2D LVIDd:         3.95 cm     Diastology LVIDs:         3.35 cm     LV e' medial:    3.81 cm/s LV PW:         2.15 cm     LV E/e' medial:  11.7 LV IVS:        1.50 cm     LV e' lateral:   6.36 cm/s LVOT diam:     2.30 cm     LV E/e' lateral: 7.0 LV SV:         106 LV SV Index:   55 LVOT Area:     4.15 cm  LV Volumes (MOD) LV vol d, MOD A2C: 58.5 ml LV vol d, MOD A4C: 78.2 ml LV vol s, MOD A2C: 34.5 ml LV vol s, MOD A4C: 50.9 ml LV SV MOD A2C:     24.0 ml LV SV MOD A4C:     78.2 ml LV SV MOD BP:      26.6  ml IVC IVC diam: 1.50 cm LEFT ATRIUM             Index       RIGHT ATRIUM           Index LA diam:        1.80 cm 0.94 cm/m  RA Area:     12.90 cm LA Vol (A2C):   66.4 ml 34.72 ml/m RA Volume:   27.30 ml  14.28 ml/m LA Vol (A4C):   53.1 ml 27.77 ml/m LA Biplane Vol: 59.9 ml 31.32 ml/m  AORTIC VALVE             PULMONIC VALVE LVOT Vmax:   123.00 cm/s PR End Diast Vel: 1.50 msec LVOT Vmean:  82.500 cm/s LVOT VTI:    0.254 m AI PHT:      696 msec  AORTA Ao Root diam: 4.15 cm Ao Asc diam:  3.80 cm MITRAL VALVE                TRICUSPID VALVE MV Area (PHT): 4.89 cm     TR Peak grad:   26.4 mmHg MV Decel Time: 155 msec     TR Vmax:        257.00 cm/s MV E velocity: 44.70 cm/s MV A velocity: 108.00 cm/s  SHUNTS MV E/A ratio:  0.41         Systemic VTI:  0.25 m                             Systemic  Diam: 2.30 cm Laurance Flatten MD Electronically signed by Laurance Flatten MD Signature Date/Time: 02/24/2020/4:23:19 PM    Final     Scheduled Meds: . enoxaparin (LOVENOX) injection  40 mg Subcutaneous Q24H  . insulin aspart  0-15 Units Subcutaneous TID WC  . multivitamin with minerals  1 tablet Oral Daily  . senna  1 tablet Oral BID   Continuous Infusions: . sodium chloride 75 mL/hr at 02/24/20 0905  . [START ON 02/25/2020] remdesivir 100 mg in NS 100 mL       LOS: 0 days   Time spent: 35 minutes.  Arnetha Courser, MD [facility]  If 7PM-7AM, please contact night-coverage [email or web address]  02/24/2020, 6:32 PM   This record has been created using Dragon voice recognition software. Errors have been sought and corrected,but may not always be located. Such creation errors do not reflect on the standard of care.

## 2020-02-24 NOTE — Evaluation (Signed)
Physical Therapy Evaluation Patient Details Name: Ruth Reynolds MRN: [id number] DOB: 12-19-1950 Today's Date: 02/24/2020   History of Present Illness  Pt is a 70 y/o female admitted after falling. Found to be COVID +. PMH includes DM, anemia, CHF, and SDH.  Clinical Impression  Pt admitted secondary to problem above with deficits below. Pt with cognitive deficits and decreased awareness of safety. Very slow processing and difficulty sequencing. Requiring min to mod A to take steps at EOB. Feel pt would benefit from SNF level therapies prior to returning home. Will continue to follow acutely.     Follow Up Recommendations SNF    Equipment Recommendations  Rolling walker with 5" wheels    Recommendations for Other Services       Precautions / Restrictions Precautions Precautions: Fall Restrictions Weight Bearing Restrictions: No      Mobility  Bed Mobility Overal bed mobility: Needs Assistance Bed Mobility: Supine to Sit;Sit to Supine     Supine to sit: Min assist;Mod assist Sit to supine: Min assist;Mod assist   General bed mobility comments: Min to mod A for sequencing and LE/trunk assist to perform bed mobility tasks. Increased time required.    Transfers Overall transfer level: Needs assistance Equipment used: Straight cane Transfers: Sit to/from Stand Sit to Stand: Min assist         General transfer comment: Min A for steadying to stand. Increased time to follow commands to stand.  Ambulation/Gait Ambulation/Gait assistance: Min assist;Mod assist Gait Distance (Feet): 2 Feet Assistive device: 1 person hand held assist;Straight cane Gait Pattern/deviations: Step-through pattern;Decreased stride length Gait velocity: Decreased   General Gait Details: Pt only able to take 2-3 steps before needing a seated rest. Pt with difficulty walking back to bed and had difficulty sequencing to take steps. Min to mod A for steadying.  Stairs            Wheelchair  Mobility    Modified Rankin (Stroke Patients Only)       Balance Overall balance assessment: Needs assistance Sitting-balance support: No upper extremity supported;Feet supported Sitting balance-Leahy Scale: Fair     Standing balance support: Bilateral upper extremity supported;During functional activity Standing balance-Leahy Scale: Poor Standing balance comment: Reliant on BUE and external support                             Pertinent Vitals/Pain Pain Assessment: Faces Faces Pain Scale: No hurt    Home Living Family/patient expects to be discharged to:: Private residence Living Arrangements: Other (Comment) (niece) Available Help at Discharge: Family;Available 24 hours/day Type of Home: House Home Access: Level entry     Home Layout: One level Home Equipment: Cane - single point      Prior Function Level of Independence: Independent with assistive device(s)         Comments: USes cane for mobility     Hand Dominance        Extremity/Trunk Assessment   Upper Extremity Assessment Upper Extremity Assessment: Defer to OT evaluation    Lower Extremity Assessment Lower Extremity Assessment: Generalized weakness    Cervical / Trunk Assessment Cervical / Trunk Assessment: Kyphotic  Communication   Communication: No difficulties  Cognition Arousal/Alertness: Awake/alert Behavior During Therapy: Flat affect Overall Cognitive Status: No family/caregiver present to determine baseline cognitive functioning  General Comments: Very slow to respond. Was not able to recall the month. Slow to follow commands with difficulty sequencing.      General Comments      Exercises     Assessment/Plan    PT Assessment Patient needs continued PT services  PT Problem List Decreased strength;Decreased balance;Decreased mobility;Decreased activity tolerance;Decreased safety awareness;Decreased knowledge of  precautions;Decreased cognition;Decreased knowledge of use of DME       PT Treatment Interventions DME instruction;Gait training;Functional mobility training;Therapeutic activities;Balance training;Therapeutic exercise;Neuromuscular re-education;Cognitive remediation;Stair training;Patient/family education    PT Goals (Current goals can be found in the Care Plan section)  Acute Rehab PT Goals Patient Stated Goal: none stated PT Goal Formulation: With patient Time For Goal Achievement: 03/09/20 Potential to Achieve Goals: Fair    Frequency Min 2X/week   Barriers to discharge        Co-evaluation               AM-PAC PT "6 Clicks" Mobility  Outcome Measure Help needed turning from your back to your side while in a flat bed without using bedrails?: A Little Help needed moving from lying on your back to sitting on the side of a flat bed without using bedrails?: A Lot Help needed moving to and from a bed to a chair (including a wheelchair)?: A Lot Help needed standing up from a chair using your arms (e.g., wheelchair or bedside chair)?: A Little Help needed to walk in hospital room?: A Lot Help needed climbing 3-5 steps with a railing? : Total 6 Click Score: 13    End of Session Equipment Utilized During Treatment: Gait belt Activity Tolerance: Patient tolerated treatment well Patient left: in bed;with call bell/phone within reach (on stretcher in ED) Nurse Communication: Mobility status PT Visit Diagnosis: Unsteadiness on feet (R26.81);Muscle weakness (generalized) (M62.81);History of falling (Z91.81)    Time: 9983-3825 PT Time Calculation (min) (ACUTE ONLY): 21 min   Charges:   PT Evaluation $PT Eval Moderate Complexity: 1 Mod          Farley Ly, PT, DPT  Acute Rehabilitation Services  Pager: [phone] Office: [phone]   Lehman Prom 02/24/2020, 2:29 PM

## 2020-02-24 NOTE — Discharge Planning (Signed)
Ruth Reynolds J. Lucretia Roers, RN, BSN, [place] [phone]  RNCM set up appointment with [facility] on 2/22 @2 :30.  Spoke with pt at bedside and advised to please arrive 15 min early and take a picture ID and your current medications.  Pt verbalizes understanding of keeping appointment.

## 2020-02-24 NOTE — ED Notes (Signed)
MS Breakfast Order Placed 

## 2020-02-24 NOTE — Progress Notes (Signed)
  Echocardiogram 2D Echocardiogram has been performed.  Ruth Reynolds 02/24/2020, 3:29 PM

## 2020-02-25 ENCOUNTER — Inpatient Hospital Stay (HOSPITAL_COMMUNITY): Payer: Medicare Other

## 2020-02-25 DIAGNOSIS — U071 COVID-19: Secondary | ICD-10-CM | POA: Diagnosis not present

## 2020-02-25 LAB — COMPREHENSIVE METABOLIC PANEL
ALT: 11 U/L (ref 0–44)
AST: 18 U/L (ref 15–41)
Albumin: 2.2 g/dL — ABNORMAL LOW (ref 3.5–5.0)
Alkaline Phosphatase: 65 U/L (ref 38–126)
Anion gap: 10 (ref 5–15)
BUN: 20 mg/dL (ref 8–23)
CO2: 22 mmol/L (ref 22–32)
Calcium: 8.3 mg/dL — ABNORMAL LOW (ref 8.9–10.3)
Chloride: 102 mmol/L (ref 98–111)
Creatinine, Ser: 1.06 mg/dL — ABNORMAL HIGH (ref 0.44–1.00)
GFR, Estimated: 57 mL/min — ABNORMAL LOW (ref >60)
Glucose, Bld: 171 mg/dL — ABNORMAL HIGH (ref 70–99)
Potassium: 4.3 mmol/L (ref 3.5–5.1)
Sodium: 134 mmol/L — ABNORMAL LOW (ref 135–145)
Total Bilirubin: 0.2 mg/dL — ABNORMAL LOW (ref 0.3–1.2)
Total Protein: 7 g/dL (ref 6.5–8.1)

## 2020-02-25 LAB — C-REACTIVE PROTEIN: CRP: 15.3 mg/dL — ABNORMAL HIGH (ref <1.0)

## 2020-02-25 LAB — CBC WITH DIFFERENTIAL/PLATELET
Abs Immature Granulocytes: 0.03 10*3/uL (ref 0.00–0.07)
Basophils Absolute: 0 10*3/uL (ref 0.0–0.1)
Basophils Relative: 0 %
Eosinophils Absolute: 0 10*3/uL (ref 0.0–0.5)
Eosinophils Relative: 1 %
HCT: 25.2 % — ABNORMAL LOW (ref 36.0–46.0)
Hemoglobin: 7.5 g/dL — ABNORMAL LOW (ref 12.0–15.0)
Immature Granulocytes: 1 %
Lymphocytes Relative: 17 %
Lymphs Abs: 1.1 10*3/uL (ref 0.7–4.0)
MCH: 22.3 pg — ABNORMAL LOW (ref 26.0–34.0)
MCHC: 29.8 g/dL — ABNORMAL LOW (ref 30.0–36.0)
MCV: 75 fL — ABNORMAL LOW (ref 80.0–100.0)
Monocytes Absolute: 1 10*3/uL (ref 0.1–1.0)
Monocytes Relative: 16 %
Neutro Abs: 4.1 10*3/uL (ref 1.7–7.7)
Neutrophils Relative %: 65 %
Platelets: 421 10*3/uL — ABNORMAL HIGH (ref 150–400)
RBC: 3.36 MIL/uL — ABNORMAL LOW (ref 3.87–5.11)
RDW: 20.7 % — ABNORMAL HIGH (ref 11.5–15.5)
WBC: 6.2 10*3/uL (ref 4.0–10.5)
nRBC: 0.3 % — ABNORMAL HIGH (ref 0.0–0.2)

## 2020-02-25 LAB — GLUCOSE, CAPILLARY
Glucose-Capillary: 140 mg/dL — ABNORMAL HIGH (ref 70–99)
Glucose-Capillary: 126 mg/dL — ABNORMAL HIGH (ref 70–99)
Glucose-Capillary: 155 mg/dL — ABNORMAL HIGH (ref 70–99)
Glucose-Capillary: 75 mg/dL (ref 70–99)

## 2020-02-25 LAB — PHOSPHORUS: Phosphorus: 4.2 mg/dL (ref 2.5–4.6)

## 2020-02-25 LAB — MAGNESIUM: Magnesium: 1.9 mg/dL (ref 1.7–2.4)

## 2020-02-25 LAB — D-DIMER, QUANTITATIVE: D-Dimer, Quant: 1.55 ug/mL-FEU — ABNORMAL HIGH (ref 0.00–0.50)

## 2020-02-25 MED ORDER — AMLODIPINE BESYLATE 5 MG PO TABS: 5.0000 mg | ORAL_TABLET | Freq: Every day | ORAL | Status: DC

## 2020-02-25 MED ORDER — FERROUS SULFATE 325 (65 FE) MG PO TABS
325.0000 mg | ORAL_TABLET | Freq: Every day | ORAL | Status: DC
  Administered 2020-02-26: 325 mg via ORAL
  Filled 2020-02-25: qty 1

## 2020-02-25 MED ORDER — METOPROLOL TARTRATE 12.5 MG HALF TABLET
12.5000 mg | ORAL_TABLET | Freq: Two times a day (BID) | ORAL | Status: DC
  Administered 2020-02-25 – 2020-02-26 (×2): 12.5 mg via ORAL
  Filled 2020-02-25 (×2): qty 1

## 2020-02-25 MED ORDER — LINAGLIPTIN 5 MG PO TABS
5.0000 mg | ORAL_TABLET | Freq: Every day | ORAL | Status: DC
  Administered 2020-02-25 – 2020-03-10 (×15): 5 mg via ORAL
  Filled 2020-02-25 (×15): qty 1

## 2020-02-25 MED ORDER — VITAMIN B-12 100 MCG PO TABS
100.0000 ug | ORAL_TABLET | Freq: Every day | ORAL | Status: DC
  Administered 2020-02-25 – 2020-03-10 (×15): 100 ug via ORAL
  Filled 2020-02-25 (×15): qty 1

## 2020-02-25 NOTE — Progress Notes (Addendum)
PROGRESS NOTE    Ruth Reynolds  WCB:[id number] DOB: 10/18/1950 DOA: 02/23/2020 PCP: Patient, No Pcp Per   Brief Narrative: 70 year old with past medical history significant for diabetes type 2, chronic anemia, history of SDH after a fall, HF PEF who presents via EMS after being found on the floor.  Patient states she was sleeping on the floor because her bed was filled with a magazine and she felt like that was the best place to fall asleep.  Admitting spoke with patient's niece who notes that patient fell yesterday and has been on the floor for roughly 24 hours.  Patient currently lives with her niece, who notes that she attempted to help patient up however patient did not want any help and continue to lay on the floor for roughly 1 day.  Niece reports generalized weakness over the past few weeks and increased fatigue.  Incontinence episode.  Over the past few months patient has been ambulated with a cane.  She will require skilled nursing placement   Assessment & Plan:   Principal Problem:   COVID-19 virus infection Active Problems:   Symptomatic anemia   Hyperglycemia due to diabetes mellitus (HCC)   Acute prerenal azotemia   Acute metabolic encephalopathy   Acute lower UTI   (HFpEF) heart failure with preserved ejection fraction (HCC)   Generalized weakness   1-COVID-19 infection: Chest x-ray with some concern for infiltrate, saturation well on room air. Continue with Remdesivir. Day 2. COVID-19 Labs  Recent Labs    02/23/20 0954 02/23/20 1510 02/24/20 1240 02/25/20 0500  DDIMER  --   --  1.89* 1.55*  FERRITIN 18 19  --   --   CRP  --   --  14.1* 15.3*    Lab Results  Component Value Date   SARSCOV2NAA POSITIVE (A) 02/23/2020   UTI: On Cipro for concern for UTI.  Urine culture negative.  Antibiotics discontinue  Fall/generalized weakness: PT recommending skilled nursing facility  Diabetes type 2: She was a started on Tradjenta. continue with Lantus sliding scale  insulin  AKI: Resolved with hydration  Acute metabolic encephalopathy: Patient is alert, slowly answering questions -will get MRI.   Iron  deficiency anemia: Started on iron supplement  Systolic Heart Failure reduced ejection fraction: Repeat echo ejection fraction 45 to 50%.  Diastolic dysfunction Resume ACE if renal function remain stable.  Will try to arrange follow up with cardiology.  Start metoprolol.   HTN; start metoprolol./  Holding lisinopril HCTZ due to AKI.   Estimated body mass index is 29.05 kg/m as calculated from the following:   Height as of 06/09/19: 5\' 6"  (1.676 m).   Weight as of 06/09/19: 81.6 kg.   DVT prophylaxis: Lovenox Code Status: Full code Family Communication: niece and sister over phone.  Disposition Plan:  Status is: Inpatient  Remains inpatient appropriate because:IV treatments appropriate due to intensity of illness or inability to take PO   Dispo: The patient is from: Home              Anticipated d/c is to: SNF              Anticipated d/c date is: 2 days              Patient currently is not medically stable to d/c.   Difficult to place patient No        Consultants:   none  Procedures:   none  Antimicrobials:    Subjective: Alert, denies pain. Slowly  answering questions.   Objective: Vitals:   02/24/20 1542 02/24/20 2026 02/25/20 0450 02/25/20 1355  BP: 130/65 (!) 153/82 (!) 164/80 (!) 144/69  Pulse: 75 66 72 82  Resp: 16 18 14    Temp: 98.5 F (36.9 C) 98.4 F (36.9 C) 98.1 F (36.7 C) 99.2 F (37.3 C)  TempSrc:    Oral  SpO2: 100% 98% 100% 97%   No intake or output data in the 24 hours ending 02/25/20 1657 There were no vitals filed for this visit.  Examination:  General exam: Appears calm and comfortable  Respiratory system: Clear to auscultation. Respiratory effort normal. Cardiovascular system: S1 & S2 heard, RRR. No JVD, murmurs, rubs, gallops or clicks. No pedal edema. Gastrointestinal system:  Abdomen is nondistended, soft and nontender. No organomegaly or masses felt. Normal bowel sounds heard. Central nervous system: Alert and oriented.  Extremities: Symmetric 5 x 5 power.   Data Reviewed: I have personally reviewed following labs and imaging studies  CBC: Recent Labs  Lab 02/23/20 0255 02/24/20 0135 02/24/20 0524 02/25/20 0500  WBC 8.1 9.3 8.5 6.2  NEUTROABS  --   --   --  4.1  HGB 7.8* 8.2* 7.3* 7.5*  HCT 25.0* 26.1* 23.5* 25.2*  MCV 72.7* 72.3* 72.5* 75.0*  PLT 500* 507* 461* 421*   Basic Metabolic Panel: Recent Labs  Lab 02/23/20 0255 02/24/20 0135 02/24/20 0524 02/25/20 0500  NA 135 135 135 134*  K 4.2 4.3 4.0 4.3  CL 99 102 103 102  CO2 22 24 22 22   GLUCOSE 330* 245* 119* 171*  BUN 26* 23 21 20   CREATININE 1.31* 1.10* 0.99 1.06*  CALCIUM 8.6* 8.4* 8.1* 8.3*  MG  --   --   --  1.9  PHOS  --   --   --  4.2   GFR: CrCl cannot be calculated (Unknown ideal weight.). Liver Function Tests: Recent Labs  Lab 02/23/20 0954 02/25/20 0500  AST 18 18  ALT 11 11  ALKPHOS 72 65  BILITOT 0.5 0.2*  PROT 8.2* 7.0  ALBUMIN 2.9* 2.2*   Recent Labs  Lab 02/23/20 0954  LIPASE 27   No results for input(s): AMMONIA in the last 168 hours. Coagulation Profile: No results for input(s): INR, PROTIME in the last 168 hours. Cardiac Enzymes: Recent Labs  Lab 02/23/20 0954  CKTOTAL 130   BNP (last 3 results) No results for input(s): PROBNP in the last 8760 hours. HbA1C: Recent Labs    02/23/20 1510  HGBA1C 9.8*   CBG: Recent Labs  Lab 02/24/20 1753 02/24/20 2027 02/25/20 0636 02/25/20 1120 02/25/20 1607  GLUCAP 171* 117* 140* 126* 155*   Lipid Profile: No results for input(s): CHOL, HDL, LDLCALC, TRIG, CHOLHDL, LDLDIRECT in the last 72 hours. Thyroid Function Tests: Recent Labs    02/23/20 1510  TSH 0.515   Anemia Panel: Recent Labs    02/23/20 0954 02/23/20 1510  VITAMINB12 316 337  FOLATE 36.6 33.8  FERRITIN 18 19  TIBC 368 368   IRON 15* 13*  RETICCTPCT 0.7 0.6   Sepsis Labs: Recent Labs  Lab 02/24/20 1556  PROCALCITON <0.10    Recent Results (from the past 240 hour(s))  SARS CORONAVIRUS 2 (TAT 6-24 HRS) Nasopharyngeal Nasopharyngeal Swab     Status: Abnormal   Collection Time: 02/23/20 11:48 AM   Specimen: Nasopharyngeal Swab  Result Value Ref Range Status   SARS Coronavirus 2 POSITIVE (A) NEGATIVE Final    Comment: (NOTE) SARS-CoV-2 target nucleic  acids are DETECTED.  The SARS-CoV-2 RNA is generally detectable in upper and lower respiratory specimens during the acute phase of infection. Positive results are indicative of the presence of SARS-CoV-2 RNA. Clinical correlation with patient history and other diagnostic information is  necessary to determine patient infection status. Positive results do not rule out bacterial infection or co-infection with other viruses.  The expected result is Negative.  Fact Sheet for Patients: HairSlick.nohttps://www.fda.gov/media/138098/download  Fact Sheet for Healthcare Providers: quierodirigir.comhttps://www.fda.gov/media/138095/download  This test is not yet approved or cleared by the [place] FDA and  has been authorized for detection and/or diagnosis of SARS-CoV-2 by FDA under an Emergency Use Authorization (EUA). This EUA will remain  in effect (meaning this test can be used) for the duration of the COVID-19 declaration under Section 564(b)(1) of the Act, 21 U. S.C. section 360bbb-3(b)(1), unless the authorization is terminated or revoked sooner.   Performed at [facility], [address] [place], [place] [address]   Urine culture     Status: None   Collection Time: 02/23/20  3:10 PM   Specimen: Urine, Catheterized  Result Value Ref Range Status   Specimen Description URINE, CATHETERIZED  Final   Special Requests NONE  Final   Culture   Final    NO GROWTH Performed at [facility], [address] [place], [place] [address]    Report Status 02/24/2020  FINAL  Final         Radiology Studies: DG Chest Port 1 View  Result Date: 02/24/2020 CLINICAL DATA:  COVID-19 positive.  Weakness. EXAM: PORTABLE CHEST 1 VIEW COMPARISON:  None. FINDINGS: There is a subtle area of airspace opacity in the lateral right base region. Lungs elsewhere clear. Heart is borderline enlarged with pulmonary vascularity normal. No adenopathy. There is aortic atherosclerosis. No bone lesions. IMPRESSION: Subtle airspace opacity lateral right base concerning for focus of developing pneumonia. Atypical organism pneumonia could present in this manner. Lungs otherwise clear. Heart borderline enlarged. Aortic Atherosclerosis (ICD10-I70.0). Electronically Signed   By: Bretta BangWilliam  Woodruff III M.D.   On: 02/24/2020 10:31   ECHOCARDIOGRAM COMPLETE  Result Date: 02/24/2020    ECHOCARDIOGRAM REPORT   Patient Name:   Ruth Reynolds Date of Exam: 02/24/2020 Medical Rec #:  [id number]   Height:       66.0 in Accession #:    [id number]  Weight:       180.0 lb Date of Birth:  01/23/1951    BSA:          1.912 m Patient Age:    69 years    BP:           122/62 mmHg Patient Gender: F           HR:           85 bpm. Exam Location:  Inpatient Procedure: 2D Echo, 3D Echo, Cardiac Doppler and Color Doppler Indications:    I50.9* Heart failure (unspecified)  History:        Patient has prior history of Echocardiogram examinations, most                 recent 11/12/2018. Stroke, Signs/Symptoms:Syncope; Risk                 Factors:Diabetes. Covid positive.  Sonographer:    Sheralyn Boatmanina West RDCS Referring Phys: [id number] MICHAEL E NORINS  Sonographer Comments: Technically difficult study due to poor echo windows. IMPRESSIONS  1. Left ventricular ejection fraction, by estimation, is  45 to 50%. The left ventricle has mildly decreased function. The left ventricle demonstrates global hypokinesis. There is moderate concentric left ventricular hypertrophy. Left ventricular diastolic parameters are consistent with Grade I diastolic  dysfunction (impaired relaxation).  2. Right ventricular systolic function is normal. The right ventricular size is normal. There is normal pulmonary artery systolic pressure.  3. The mitral valve is degenerative. Mild mitral valve regurgitation.  4. The aortic valve is tricuspid. Aortic valve regurgitation is mild.  5. Aortic dilatation noted. There is mild dilatation of the ascending aorta, measuring 38 mm.  6. The inferior vena cava is normal in size with greater than 50% respiratory variability, suggesting right atrial pressure of 3 mmHg. Comparison(s): Compared to prior echo report (images would not load) in 2016, the LVEF is now mildly reduced at 45-50%. FINDINGS  Left Ventricle: Left ventricular ejection fraction, by estimation, is 45 to 50%. The left ventricle has mildly decreased function. The left ventricle demonstrates global hypokinesis. The left ventricular internal cavity size was normal in size. There is  moderate concentric left ventricular hypertrophy. Left ventricular diastolic parameters are consistent with Grade I diastolic dysfunction (impaired relaxation). Right Ventricle: The right ventricular size is normal. No increase in right ventricular wall thickness. Right ventricular systolic function is normal. There is normal pulmonary artery systolic pressure. The tricuspid regurgitant velocity is 2.57 m/s, and  with an assumed right atrial pressure of 8 mmHg, the estimated right ventricular systolic pressure is 34.4 mmHg. Left Atrium: Left atrial size was normal in size. Right Atrium: Right atrial size was normal in size. Pericardium: There is no evidence of pericardial effusion. Mitral Valve: The mitral valve is degenerative in appearance. There is mild thickening of the mitral valve leaflet(s). There is mild calcification of the mitral valve leaflet(s). Mild mitral annular calcification. Mild mitral valve regurgitation. Tricuspid Valve: The tricuspid valve is normal in structure. Tricuspid valve  regurgitation is mild. Aortic Valve: The aortic valve is tricuspid. Aortic valve regurgitation is mild. Aortic regurgitation PHT measures 696 msec. Pulmonic Valve: The pulmonic valve was normal in structure. Pulmonic valve regurgitation is mild. Aorta: Aortic dilatation noted. There is mild dilatation of the ascending aorta, measuring 38 mm. Venous: The inferior vena cava is normal in size with greater than 50% respiratory variability, suggesting right atrial pressure of 3 mmHg. IAS/Shunts: No atrial level shunt detected by color flow Doppler.  LEFT VENTRICLE PLAX 2D LVIDd:         3.95 cm     Diastology LVIDs:         3.35 cm     LV e' medial:    3.81 cm/s LV PW:         2.15 cm     LV E/e' medial:  11.7 LV IVS:        1.50 cm     LV e' lateral:   6.36 cm/s LVOT diam:     2.30 cm     LV E/e' lateral: 7.0 LV SV:         106 LV SV Index:   55 LVOT Area:     4.15 cm  LV Volumes (MOD) LV vol d, MOD A2C: 58.5 ml LV vol d, MOD A4C: 78.2 ml LV vol s, MOD A2C: 34.5 ml LV vol s, MOD A4C: 50.9 ml LV SV MOD A2C:     24.0 ml LV SV MOD A4C:     78.2 ml LV SV MOD BP:      26.6 ml IVC  IVC diam: 1.50 cm LEFT ATRIUM             Index       RIGHT ATRIUM           Index LA diam:        1.80 cm 0.94 cm/m  RA Area:     12.90 cm LA Vol (A2C):   66.4 ml 34.72 ml/m RA Volume:   27.30 ml  14.28 ml/m LA Vol (A4C):   53.1 ml 27.77 ml/m LA Biplane Vol: 59.9 ml 31.32 ml/m  AORTIC VALVE             PULMONIC VALVE LVOT Vmax:   123.00 cm/s PR End Diast Vel: 1.50 msec LVOT Vmean:  82.500 cm/s LVOT VTI:    0.254 m AI PHT:      696 msec  AORTA Ao Root diam: 4.15 cm Ao Asc diam:  3.80 cm MITRAL VALVE                TRICUSPID VALVE MV Area (PHT): 4.89 cm     TR Peak grad:   26.4 mmHg MV Decel Time: 155 msec     TR Vmax:        257.00 cm/s MV E velocity: 44.70 cm/s MV A velocity: 108.00 cm/s  SHUNTS MV E/A ratio:  0.41         Systemic VTI:  0.25 m                             Systemic Diam: 2.30 cm Laurance Flatten MD Electronically signed by  Laurance Flatten MD Signature Date/Time: 02/24/2020/4:23:19 PM    Final         Scheduled Meds: . vitamin C  500 mg Oral Daily  . enoxaparin (LOVENOX) injection  40 mg Subcutaneous Q24H  . insulin aspart  0-15 Units Subcutaneous TID WC  . Ipratropium-Albuterol  1 puff Inhalation Q6H  . multivitamin with minerals  1 tablet Oral Daily  . senna  1 tablet Oral BID  . zinc sulfate  220 mg Oral Daily   Continuous Infusions: . remdesivir 100 mg in NS 100 mL 100 mg (02/25/20 0842)     LOS: 1 day    Time spent: 35 minutes    Semir Brill A Ellora Varnum, MD [facility]   If 7PM-7AM, please contact night-coverage [email or web address]  02/25/2020, 4:57 PM

## 2020-02-25 NOTE — TOC Initial Note (Signed)
Transition of Care [facility]) - Initial/Assessment Note    Patient Details  Name: Ruth Reynolds MRN: [id number] Date of Birth: 31-Jul-1950  [facility] CM/SW Contact:    Beckie Busing, RN Phone Number: [phone]  02/25/2020, 9:45 AM  Clinical Narrative:                 [facility] consulted for SNF placement for rehab. CM called via phone to discuss options for rehab with the patient. Patient states that she is not sure about rehab and would like to discuss with niece. Patient does not give CM consent to call niece at this time. Patient states that she will talk with first. Patient would like to speak with niece before making any decisions. CM will reach out to patient at a later time.   Expected Discharge Plan: Skilled Nursing Facility Barriers to Discharge: Continued Medical Work up   Patient Goals and CMS Choice Patient states their goals for this hospitalization and ongoing recovery are:: Wants to go home soon      Expected Discharge Plan and Services Expected Discharge Plan: Skilled Nursing Facility In-house Referral: NA Discharge Planning Services: CM Consult   Living arrangements for the past 2 months: Single Family Home                 DME Arranged: N/A DME Agency: NA                  Prior Living Arrangements/Services Living arrangements for the past 2 months: Single Family Home Lives with:: Relatives (niece) Patient language and need for interpreter reviewed:: Yes Do you feel safe going back to the place where you live?: Yes      Need for Family Participation in Patient Care: Yes (Comment) Care giver support system in place?: Yes (comment) Current home services:  (none) Criminal Activity/Legal Involvement Pertinent to Current Situation/Hospitalization: No - Comment as needed  Activities of Daily Living      Permission Sought/Granted Permission sought to share information with : Family Supports Permission granted to share information with : No               Emotional Assessment Appearance::  (unable to assess) Attitude/Demeanor/Rapport: Unable to Assess Affect (typically observed): Unable to Assess Orientation: : Oriented to Self,Oriented to Place,Oriented to  Time,Oriented to Situation Alcohol / Substance Use: Not Applicable Psych Involvement: No (comment)  Admission diagnosis:  Generalized weakness [R53.1] Acute prerenal azotemia [N19] Symptomatic anemia [D64.9] COVID-19 virus infection [U07.1] Patient Active Problem List   Diagnosis Date Noted  . Generalized weakness   . COVID-19 virus infection   . Hyperglycemia due to diabetes mellitus (HCC) 02/23/2020  . Acute prerenal azotemia 02/23/2020  . Acute metabolic encephalopathy 02/23/2020  . Acute lower UTI 02/23/2020  . (HFpEF) heart failure with preserved ejection fraction (HCC) 02/23/2020  . Syncope 11/11/2014  . CKD (chronic kidney disease) 11/11/2014  . Symptomatic anemia 11/11/2014   PCP:  Patient, No Pcp Per Pharmacy:   [facility] - [place], [place] - [address] [place] [address] Phone: [phone] Fax: [phone]  [id number] - [place], [place] - [address] [place] [address] Phone: [phone] Fax: [phone]     Social Determinants of Health (SDOH) Interventions    Readmission Risk Interventions No flowsheet data found.

## 2020-02-26 ENCOUNTER — Encounter (HOSPITAL_COMMUNITY): Payer: Self-pay | Admitting: Internal Medicine

## 2020-02-26 ENCOUNTER — Inpatient Hospital Stay (HOSPITAL_COMMUNITY): Payer: Medicare Other

## 2020-02-26 ENCOUNTER — Encounter (HOSPITAL_COMMUNITY): Payer: Medicare Other

## 2020-02-26 DIAGNOSIS — U071 COVID-19: Secondary | ICD-10-CM | POA: Diagnosis not present

## 2020-02-26 DIAGNOSIS — I6330 Cerebral infarction due to thrombosis of unspecified cerebral artery: Secondary | ICD-10-CM | POA: Diagnosis not present

## 2020-02-26 LAB — C-REACTIVE PROTEIN: CRP: 13.6 mg/dL — ABNORMAL HIGH (ref <1.0)

## 2020-02-26 LAB — COMPREHENSIVE METABOLIC PANEL
ALT: 11 U/L (ref 0–44)
AST: 14 U/L — ABNORMAL LOW (ref 15–41)
Albumin: 2.2 g/dL — ABNORMAL LOW (ref 3.5–5.0)
Alkaline Phosphatase: 64 U/L (ref 38–126)
Anion gap: 9 (ref 5–15)
BUN: 18 mg/dL (ref 8–23)
CO2: 24 mmol/L (ref 22–32)
Calcium: 8.5 mg/dL — ABNORMAL LOW (ref 8.9–10.3)
Chloride: 104 mmol/L (ref 98–111)
Creatinine, Ser: 0.89 mg/dL (ref 0.44–1.00)
GFR, Estimated: >60 mL/min (ref >60)
Glucose, Bld: 91 mg/dL (ref 70–99)
Potassium: 4.1 mmol/L (ref 3.5–5.1)
Sodium: 137 mmol/L (ref 135–145)
Total Bilirubin: 0.5 mg/dL (ref 0.3–1.2)
Total Protein: 6.7 g/dL (ref 6.5–8.1)

## 2020-02-26 LAB — CBC WITH DIFFERENTIAL/PLATELET
Abs Immature Granulocytes: 0 10*3/uL (ref 0.00–0.07)
Basophils Absolute: 0.1 10*3/uL (ref 0.0–0.1)
Basophils Relative: 1 %
Eosinophils Absolute: 0 10*3/uL (ref 0.0–0.5)
Eosinophils Relative: 0 %
HCT: 22.5 % — ABNORMAL LOW (ref 36.0–46.0)
Hemoglobin: 7.2 g/dL — ABNORMAL LOW (ref 12.0–15.0)
Lymphocytes Relative: 8 %
Lymphs Abs: 0.6 10*3/uL — ABNORMAL LOW (ref 0.7–4.0)
MCH: 22.9 pg — ABNORMAL LOW (ref 26.0–34.0)
MCHC: 32 g/dL (ref 30.0–36.0)
MCV: 71.4 fL — ABNORMAL LOW (ref 80.0–100.0)
Monocytes Absolute: 0.9 10*3/uL (ref 0.1–1.0)
Monocytes Relative: 13 %
Neutro Abs: 5.4 10*3/uL (ref 1.7–7.7)
Neutrophils Relative %: 78 %
Platelets: 490 10*3/uL — ABNORMAL HIGH (ref 150–400)
RBC: 3.15 MIL/uL — ABNORMAL LOW (ref 3.87–5.11)
RDW: 19.9 % — ABNORMAL HIGH (ref 11.5–15.5)
WBC: 6.9 10*3/uL (ref 4.0–10.5)
nRBC: 0 % (ref 0.0–0.2)
nRBC: 0 /100 WBC

## 2020-02-26 LAB — GLUCOSE, CAPILLARY
Glucose-Capillary: 136 mg/dL — ABNORMAL HIGH (ref 70–99)
Glucose-Capillary: 89 mg/dL (ref 70–99)
Glucose-Capillary: 124 mg/dL — ABNORMAL HIGH (ref 70–99)
Glucose-Capillary: 207 mg/dL — ABNORMAL HIGH (ref 70–99)

## 2020-02-26 LAB — D-DIMER, QUANTITATIVE: D-Dimer, Quant: 2.16 ug/mL-FEU — ABNORMAL HIGH (ref 0.00–0.50)

## 2020-02-26 LAB — PHOSPHORUS: Phosphorus: 4.2 mg/dL (ref 2.5–4.6)

## 2020-02-26 LAB — MAGNESIUM: Magnesium: 2 mg/dL (ref 1.7–2.4)

## 2020-02-26 MED ORDER — LISINOPRIL 5 MG PO TABS
5.0000 mg | ORAL_TABLET | Freq: Every day | ORAL | Status: DC
  Administered 2020-02-26: 5 mg via ORAL
  Filled 2020-02-26: qty 1

## 2020-02-26 MED ORDER — IOHEXOL 350 MG/ML SOLN
75.0000 mL | Freq: Once | INTRAVENOUS | Status: AC | PRN
  Administered 2020-02-26: 75 mL via INTRAVENOUS

## 2020-02-26 MED ORDER — ASPIRIN 325 MG PO TABS
325.0000 mg | ORAL_TABLET | Freq: Every day | ORAL | Status: DC
  Administered 2020-02-26: 325 mg via ORAL
  Filled 2020-02-26: qty 1

## 2020-02-26 MED ORDER — ASPIRIN EC 81 MG PO TBEC: 81.0000 mg | DELAYED_RELEASE_TABLET | Freq: Every day | ORAL | Status: DC

## 2020-02-26 MED ORDER — IPRATROPIUM-ALBUTEROL 20-100 MCG/ACT IN AERS: 1.0000 | INHALATION_SPRAY | Freq: Four times a day (QID) | RESPIRATORY_TRACT | Status: DC | PRN

## 2020-02-26 MED ORDER — ASPIRIN EC 81 MG PO TBEC
81.0000 mg | DELAYED_RELEASE_TABLET | Freq: Every day | ORAL | Status: DC
  Administered 2020-02-27 – 2020-03-10 (×13): 81 mg via ORAL
  Filled 2020-02-26 (×13): qty 1

## 2020-02-26 MED ORDER — LOSARTAN POTASSIUM 25 MG PO TABS
25.0000 mg | ORAL_TABLET | Freq: Every day | ORAL | Status: DC
  Administered 2020-02-27 – 2020-03-10 (×13): 25 mg via ORAL
  Filled 2020-02-26 (×13): qty 1

## 2020-02-26 MED ORDER — CLOPIDOGREL BISULFATE 75 MG PO TABS
300.0000 mg | ORAL_TABLET | Freq: Once | ORAL | Status: AC
  Administered 2020-02-26: 300 mg via ORAL
  Filled 2020-02-26: qty 4

## 2020-02-26 MED ORDER — CLOPIDOGREL BISULFATE 75 MG PO TABS
75.0000 mg | ORAL_TABLET | Freq: Every day | ORAL | Status: DC
  Administered 2020-02-27 – 2020-03-10 (×13): 75 mg via ORAL
  Filled 2020-02-26 (×13): qty 1

## 2020-02-26 MED ORDER — STROKE: EARLY STAGES OF RECOVERY BOOK
Freq: Once | Status: AC
  Filled 2020-02-26: qty 1

## 2020-02-26 MED ORDER — METOPROLOL SUCCINATE ER 25 MG PO TB24
25.0000 mg | ORAL_TABLET | Freq: Every day | ORAL | Status: DC
  Administered 2020-02-27 – 2020-03-10 (×13): 25 mg via ORAL
  Filled 2020-02-26 (×13): qty 1

## 2020-02-26 NOTE — Consult Note (Signed)
Neurology Consult   CC: MRI + for stroke  History is obtained from: chart  HPI: Ruth Reynolds is a 70 y.o. female with a PMHx of uncontrolled DM II, HTN, SDH in 2019 after a fall without sequelae, and CHF. Patient was found down on floor but responsive by EMS on 02/23/20. Reportedly, patient had slept on the floor the night before because her bed was full of magazines. She had fallen 02/22/20 and did not want to move, so lied down for about 24 hours. Per niece on admission, patient has had generalized weakness with fatigue over past several weeks prior to admission. Patient lives with niece. Over past few months, patient had ambulated with a cane. Plan for d/c is SNF, however patient is interested in getting a place of her own home living independently.  She reports concerns that she has been unable to contact her family, that the phone line is busy every time she tries to call, and she is concerned that the niece with whom she lives and/or her niece's children may be stealing her Social Security money.   Patient does not feel confused. Review of chart shows family reporting no hx of dementia or memory loss. No behavioral changes. + less active and more withdrawn.   CK on admission 130. Pt was + for COVID and is being treated for that with Remedesivir. Found to have UTI but cx neg, so Cipro discontinued. Started on Tradjenta for DM with Lantus and SSI. HbA1c 9.8% 02/23/20.   NIHSS:  1a Level of Conscious: 0 1b LOC Questions: 0 1c LOC Commands: 0 2 Best Gaze: 0 3 Visual: 0 4 Facial Palsy: 0 5a Motor Arm - left: 0 --however there is slight pronation without drift 5b Motor Arm - Right: 0 6a Motor Leg - Left: 0 6b Motor Leg - Right: 0 7 Limb Ataxia: 0 8 Sensory: 0 9 Best Language: 0 10 Dysarthria: 0 11 Extinct. and Inatten.: 0 TOTAL: 0   ROS: A complete ROS was performed and is negative except as noted in the HPI.   Past Medical History:  Diagnosis Date  . (HFpEF) heart failure with  preserved ejection fraction (HCC) 2016   Echo with EF 55-60%, Grade II diastolic dysfxn  . DM2 (diabetes mellitus, type 2) (HCC)   . Iron deficiency anemia   . SDH (subdural hematoma) (HCC) 2019   after fall. Left. No sequellae    Family History  Problem Relation Age of Onset  . Heart disease Father     Social History:  reports that she has never smoked. She has never used smokeless tobacco. She reports current alcohol use. She reports that she does not use drugs.   Prior to Admission medications   Medication Sig Start Date End Date Taking? Authorizing Provider  lisinopril-hydrochlorothiazide (ZESTORETIC) 20-12.5 MG tablet Take 1 tablet by mouth daily. 01/01/20  Yes [provider]    Exam: Current vital signs: BP (!) 160/76 (BP Location: Left Arm)   Pulse 74   Temp 99 F (37.2 C) (Oral)   Resp 16   Ht 5\' 6"  (1.676 m)   Wt 81.6 kg   SpO2 99%   BMI 29.04 kg/m   Physical Exam  Constitutional: Appears well-developed and well-nourished.  Psych: Affect is flat. Seems annoyed with some examiner's requests; on later attending examination her affect is somewhat labile, flat/suspicious appearing at times and unexpected laughter at other times.  She is also somewhat tangential in her responses and perseverates on her suspicions  about family etc. Eyes: No scleral injection HENT: No OP obstruction, poor dentition Head: Normocephalic.  Cardiovascular: RRR Respiratory: Effort normal.  GI: Soft.  Skin: WDI  Neuro: Mental Status: Patient is awake, alert, oriented to person, place, month, year, and situation.   Patient is unable to give a clear and coherent history. No signs of aphasia or neglect. Speech/Language: Speech is intact and fluent. No dysarthria Comprehension, repetition, and naming intact.  Cranial Nerves: II: Visual Fields are full. Pupils are equal, round, and reactive to light. III,IV, VI: EOMI without ptosis or diploplia.  V: Facial sensation is symmetric  to light touch. Able to move jaw back and forth.  VII: Smile is symmetrical. Able to puff cheeks and raise eyebrows.  VIII: hearing is intact to voice IX, X: Uvula elevates symmetrically. Phonation normal.  XI: Shoulder shrug is symmetric. XII: tongue is midline without atrophy or fasciculations.  Motor: Tone is normal. Bulk is normal. 5/5 strength throughout except for mild bilateral hip flexion weakness and 4/5 left finger extension. On pronator drift testing she has slight pronation of the left upper extremity without drift Sensory: Sensation is symmetric to light touch in the arms and legs. Extinction intact.  Plantars: Toes are downgoing bilaterally. Cerebellar: FNF intact. No drift UE/LE.  Gait: Slightly wide-based, able to rise on her toes but not well on her heels on either side.  Given she ambulates at baseline with a cane, did not attempt to tandem.  Labs are notable for GFR greater than 60, creatinine 0.89 Low normal B12 at 316 Thrombocytosis on CBC to 490, potentially reactive Stable anemia at 7.2 since admission, no recent baseline Elevated D-dimer at 2.16  No results found for: CHOL, HDL, LDLCALC, LDLDIRECT, TRIG, CHOLHDL Lab Results  Component Value Date   HGBA1C 9.8 (H) 02/23/2020   Lab Results  Component Value Date   TSH 0.515 02/23/2020     IMAGING  02/23/20 NCT head showed  1. No focal acute intracranial abnormality identified. 2. Chronic diffuse atrophy and chronic bilateral periventricular white matter small vessel ischemic change. 3. No acute fracture or dislocation of cervical spine. 4. Degenerative joint changes of cervical spine.  02/25/20 MRI brain showed 1. 7 mm focus of diffusion abnormality involving the right paramedian pons, consistent with an acute to early subacute small vessel type infarct. No associated hemorrhage or mass effect. 2. No other acute intracranial abnormality. 3. Age-related cerebral atrophy with advanced chronic  microvascular ischemic disease, with multiple superimposed remote lacunar infarcts involving the hemispheric cerebral white matter and deep gray nuclei.  02/24/20 ECHO 1. Left ventricular ejection fraction, by estimation, is 45 to 50%. The  left ventricle has mildly decreased function. The left ventricle  demonstrates global hypokinesis. There is moderate concentric left  ventricular hypertrophy. Left ventricular  diastolic parameters are consistent with Grade I diastolic dysfunction  (impaired relaxation).  2. Right ventricular systolic function is normal. The right ventricular  size is normal. There is normal pulmonary artery systolic pressure.  3. The mitral valve is degenerative. Mild mitral valve regurgitation.  4. The aortic valve is tricuspid. Aortic valve regurgitation is mild.  5. Aortic dilatation noted. There is mild dilatation of the ascending  aorta, measuring 38 mm.  6. The inferior vena cava is normal in size with greater than 50%  respiratory variability, suggesting right atrial pressure of 3 mmHg.   Assessment: Ruth Reynolds is a 70 y.o. female PMHx of SDH 2019, uncontrolled IDDM II, CHF, HTN, and chronic anemia. She was  found to have metabolic encephalopathy here, but chart note from yesterday stated patient was back to baseline. However, MRI brain resulted and showed incidental finding of a diffusion abnormality of right paramedian pons consistent with an acute to early subacute small vessel infarct. She has age related cerebral atrophy with advanced chronic microvascular ischemic disease likely due to uncontrolled DM II and hypertensive disease. Also, present on MRI brain were multiple remote lacunar infarcts as above. She is non focal on exam which could mean whatever symptoms she may have had, have resolved.  Her azotemia, hyperglycemia, and ? UTI could have added to her encephalopathic presentation.   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 Likely occurred in days leading up to admission.  Etiology is likely small vessel disease in the setting of her small vessel risk factors which are poorly controlled.  Acute inflammatory state of COVID-19 may be an additional factor  Plan: - CTA head and neck. - TTE completed 01/24  - lipid panel - Recommend Statin if LDL > 70 - tight sugar control.  - Aspirin 81mg  daily-ordered - Clopidogrel 300mg  load, followed by Plavix 75mg  po qd x 21 days-ordered.  - SBP goal - Is out of window for permissive hypertension. Goal normotensive.  - Telemetry monitoring for arrhythmia. - Recommend bedside Swallow screen if not already done. - Recommend Stroke education. - Recommend PT/OT/SLP consult. - consider treating possible depression - Stroke team to follow - Appreciate primary team's attention to patient's social situation and concerns about her living situation, with involvement of social work team or psychiatry if necessary  Pt seen by , NP and later by MD. Note/plan to be edited by MD as necessary.  Pager:  Attending Neurologist's note:  I personally saw this patient, gathering history, performing a full neurologic examination, reviewing relevant labs, personally reviewing relevant imaging including MRI brain, and formulated the assessment and plan, adding the note above for completeness and clarity to accurately reflect my thoughts

## 2020-02-26 NOTE — Progress Notes (Addendum)
PROGRESS NOTE    Ruth Reynolds  EPP:[id number] DOB: [birth year] DOA: 02/23/2020 PCP: Patient, No Pcp Per   Brief Narrative: 70 year old with past medical history significant for diabetes type 2, chronic anemia, history of SDH after a fall, HF PEF who presents via EMS after being found on the floor.  Patient states she was sleeping on the floor because her bed was filled with a magazine and she felt like that was the best place to fall asleep.  Admitting spoke with patient's niece who notes that patient fell yesterday and has been on the floor for roughly 24 hours.  Patient currently lives with her niece, who notes that she attempted to help patient up however patient did not want any help and continue to lay on the floor for roughly 1 day.  Niece reports generalized weakness over the past few weeks and increased fatigue.  Incontinence episode.  Over the past few months patient has been ambulated with a cane.  She will require skilled nursing placement   Assessment & Plan:   Principal Problem:   COVID-19 virus infection Active Problems:   Symptomatic anemia   Hyperglycemia due to diabetes mellitus (HCC)   Acute prerenal azotemia   Acute metabolic encephalopathy   Acute lower UTI   (HFpEF) heart failure with preserved ejection fraction (HCC)   Generalized weakness   1-COVID-19 infection: Chest x-ray with some concern for infiltrate, saturation well on room air. Received 3 days of Remdesivir.  COVID-19 Labs  Recent Labs    02/24/20 1240 02/25/20 0500 02/26/20 0412  DDIMER 1.89* 1.55* 2.16*  CRP 14.1* 15.3* 13.6*    Lab Results  Component Value Date   SARSCOV2NAA POSITIVE (A) 02/23/2020   UTI: On Cipro for concern for UTI.  Urine culture negative.  Antibiotics discontinue.  Fall/generalized weakness: PT recommending skilled nursing facility  Diabetes type 2: She was a started on Tradjenta. continue with Lantus sliding scale insulin  AKI: Resolved with hydration  Acute-  sub acute stroke: Acute metabolic encephalopathy: Patient is alert, slowly answering questions -MRI positive for stroke. 7 mm focus diffusion abnormality involving right paramedian pons, consistent with an acute to sub acute infarct. Multiples remote lacunar infarct.  -Check Lipid panel.  -Hb A1c. 9.8 -Started on aspirin and plavix.   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  Diastolic dysfunction Resume ACE if renal function remain stable.  Cardiology  Will help arrange follow up/.  Started metoprolol.   HTN; started  metoprolol./  Resume lisinopril. HCTZ due to AKI.   Estimated body mass index is 29.04 kg/m as calculated from the following:   Height as of this encounter: 5\' 6"  (1.676 m).   Weight as of this encounter: 81.6 kg.   DVT prophylaxis: Lovenox Code Status: Full code Family Communication: niece and sister over phone.  Disposition Plan:  Status is: Inpatient  Remains inpatient appropriate because:IV treatments appropriate due to intensity of illness or inability to take PO   Dispo: The patient is from: Home              Anticipated d/c is to: SNF              Anticipated d/c date is: 2 days              Patient currently is not medically stable to d/c.   Difficult to place patient No  Consultants:   none  Procedures:   none  Antimicrobials:    Subjective: She denies dyspnea, cough.    Objective: Vitals:   02/25/20 2020 02/25/20 2108 02/26/20 0600 02/26/20 0800  BP:  (!) 141/84 (!) 129/106 (!) 160/76  Pulse:  78 96 74  Resp:  20 18 16   Temp:  97.8 F (36.6 C) 98.6 F (37 C) 99 F (37.2 C)  TempSrc:    Oral  SpO2: 99% 100% 100% 99%  Weight:    81.6 kg  Height:    5\' 6"  (1.676 m)   No intake or output data in the 24 hours ending 02/26/20 1512 Filed Weights   02/26/20 0800  Weight: 81.6 kg    Examination:  General  exam: NAD Respiratory system: CTA Cardiovascular system: S 1, S 2 RRR Gastrointestinal system: BS present, soft, nt Central nervous system: alert, follow some command Extremities: move all extremities.    Data Reviewed: I have personally reviewed following labs and imaging studies  CBC: Recent Labs  Lab 02/23/20 0255 02/24/20 0135 02/24/20 0524 02/25/20 0500 02/26/20 0412  WBC 8.1 9.3 8.5 6.2 6.9  NEUTROABS  --   --   --  4.1 5.4  HGB 7.8* 8.2* 7.3* 7.5* 7.2*  HCT 25.0* 26.1* 23.5* 25.2* 22.5*  MCV 72.7* 72.3* 72.5* 75.0* 71.4*  PLT 500* 507* 461* 421* 490*   Basic Metabolic Panel: Recent Labs  Lab 02/23/20 0255 02/24/20 0135 02/24/20 0524 02/25/20 0500 02/26/20 0412  NA 135 135 135 134* 137  K 4.2 4.3 4.0 4.3 4.1  CL 99 102 103 102 104  CO2 22 24 22 22 24   GLUCOSE 330* 245* 119* 171* 91  BUN 26* 23 21 20 18   CREATININE 1.31* 1.10* 0.99 1.06* 0.89  CALCIUM 8.6* 8.4* 8.1* 8.3* 8.5*  MG  --   --   --  1.9 2.0  PHOS  --   --   --  4.2 4.2   GFR: Estimated Creatinine Clearance: 64.2 mL/min (by C-G formula based on SCr of 0.89 mg/dL). Liver Function Tests: Recent Labs  Lab 02/23/20 0954 02/25/20 0500 02/26/20 0412  AST 18 18 14*  ALT 11 11 11   ALKPHOS 72 65 64  BILITOT 0.5 0.2* 0.5  PROT 8.2* 7.0 6.7  ALBUMIN 2.9* 2.2* 2.2*   Recent Labs  Lab 02/23/20 0954  LIPASE 27   No results for input(s): AMMONIA in the last 168 hours. Coagulation Profile: No results for input(s): INR, PROTIME in the last 168 hours. Cardiac Enzymes: Recent Labs  Lab 02/23/20 0954  CKTOTAL 130   BNP (last 3 results) No results for input(s): PROBNP in the last 8760 hours. HbA1C: No results for input(s): HGBA1C in the last 72 hours. CBG: Recent Labs  Lab 02/25/20 1120 02/25/20 1607 02/25/20 2108 02/26/20 0758 02/26/20 1153  GLUCAP 126* 155* 75 136* 89   Lipid Profile: No results for input(s): CHOL, HDL, LDLCALC, TRIG, CHOLHDL, LDLDIRECT in the last 72  hours. Thyroid Function Tests: No results for input(s): TSH, T4TOTAL, FREET4, T3FREE, THYROIDAB in the last 72 hours. Anemia Panel: No results for input(s): VITAMINB12, FOLATE, FERRITIN, TIBC, IRON, RETICCTPCT in the last 72 hours. Sepsis Labs: Recent Labs  Lab 02/24/20 1556  PROCALCITON <0.10    Recent Results (from the past 240 hour(s))  SARS CORONAVIRUS 2 (TAT 6-24 HRS) Nasopharyngeal Nasopharyngeal Swab     Status: Abnormal   Collection Time: 02/23/20 11:48 AM   Specimen: Nasopharyngeal Swab  Result Value Ref  Range Status   SARS Coronavirus 2 POSITIVE (A) NEGATIVE Final    Comment: (NOTE) SARS-CoV-2 target nucleic acids are DETECTED.  The SARS-CoV-2 RNA is generally detectable in upper and lower respiratory specimens during the acute phase of infection. Positive results are indicative of the presence of SARS-CoV-2 RNA. Clinical correlation with patient history and other diagnostic information is  necessary to determine patient infection status. Positive results do not rule out bacterial infection or co-infection with other viruses.  The expected result is Negative.  Fact Sheet for Patients: HairSlick.nohttps://www.fda.gov/media/138098/download  Fact Sheet for Healthcare Providers: quierodirigir.comhttps://www.fda.gov/media/138095/download  This test is not yet approved or cleared by the [place] FDA and  has been authorized for detection and/or diagnosis of SARS-CoV-2 by FDA under an Emergency Use Authorization (EUA). This EUA will remain  in effect (meaning this test can be used) for the duration of the COVID-19 declaration under Section 564(b)(1) of the Act, 21 U. S.C. section 360bbb-3(b)(1), unless the authorization is terminated or revoked sooner.   Performed at [facility], [address] [place], [place] [address]   Urine culture     Status: None   Collection Time: 02/23/20  3:10 PM   Specimen: Urine, Catheterized  Result Value Ref Range Status   Specimen Description  URINE, CATHETERIZED  Final   Special Requests NONE  Final   Culture   Final    NO GROWTH Performed at [facility], [address] [place], [place] [address]    Report Status 02/24/2020 FINAL  Final         Radiology Studies: MR BRAIN WO CONTRAST  Result Date: 02/26/2020 CLINICAL DATA:  Initial evaluation for acute delirium, found down. EXAM: MRI HEAD WITHOUT CONTRAST TECHNIQUE: Multiplanar, multiecho pulse sequences of the brain and surrounding structures were obtained without intravenous contrast. COMPARISON:  Prior CT from 02/23/2020. FINDINGS: Brain: Diffuse prominence of the CSF containing spaces compatible with generalized age-related cerebral atrophy. Extensive patchy and confluent T2/FLAIR hyperintensity throughout the periventricular and deep white matter both cerebral hemispheres as well as the pons total most consistent with chronic microvascular ischemic disease, advanced in nature. Multiple superimposed remote lacunar infarcts present about the periventricular white matter of the corona radiata as well as the deep gray nuclei. 7 mm focus of mild diffusion abnormality seen involving the right paramedian pons, consistent with an acute to subacute small vessel type infarct (series 5, image 61). No associated hemorrhage or mass effect. No other foci of diffusion abnormality to suggest acute or subacute ischemia. Gray-white matter differentiation otherwise maintained. No encephalomalacia to suggest chronic cortical infarction elsewhere within the brain. No other foci of susceptibility artifact to suggest acute or chronic intracranial hemorrhage. No mass lesion, midline shift or mass effect. Mild diffuse ventricular prominence related to global parenchymal volume loss without hydrocephalus. No made of a partially empty sella. No extra-axial fluid collection. Midline structures intact. Vascular: Major intracranial vascular flow voids are maintained. Strongly dominant left vertebral  artery noted. Skull and upper cervical spine: Craniocervical junction within normal limits. Upper cervical spine demonstrates no acute finding. Bone marrow signal intensity within normal limits. Scalp soft tissues unremarkable. Sinuses/Orbits: Globes and orbital soft tissues within normal limits. Mild-to-moderate mucosal thickening noted throughout the paranasal sinuses, likely allergic/inflammatory nature. Mastoid air cells are largely clear. Other: None. IMPRESSION: 1. 7 mm focus of diffusion abnormality involving the right paramedian pons, consistent with an acute to early subacute small vessel type infarct. No associated hemorrhage or mass effect. 2. No  other acute intracranial abnormality. 3. Age-related cerebral atrophy with advanced chronic microvascular ischemic disease, with multiple superimposed remote lacunar infarcts involving the hemispheric cerebral white matter and deep gray nuclei. Electronically Signed   By: Rise Mu M.D.   On: 02/26/2020 00:24   ECHOCARDIOGRAM COMPLETE  Result Date: 02/24/2020    ECHOCARDIOGRAM REPORT   Patient Name:   GWENDALYNN ECKSTROM Date of Exam: 02/24/2020 Medical Rec #:  [id number]   Height:       66.0 in Accession #:    [id number]  Weight:       180.0 lb Date of Birth:  [birth year]    BSA:          1.912 m Patient Age:    69 years    BP:           122/62 mmHg Patient Gender: F           HR:           85 bpm. Exam Location:  Inpatient Procedure: 2D Echo, 3D Echo, Cardiac Doppler and Color Doppler Indications:    I50.9* Heart failure (unspecified)  History:        Patient has prior history of Echocardiogram examinations, most                 recent 11/12/2018. Stroke, Signs/Symptoms:Syncope; Risk                 Factors:Diabetes. Covid positive.  Sonographer:    Sheralyn Boatman RDCS Referring Phys: 17 MICHAEL E NORINS  Sonographer Comments: Technically difficult study due to poor echo windows. IMPRESSIONS  1. Left ventricular ejection fraction, by estimation, is 45 to 50%.  The left ventricle has mildly decreased function. The left ventricle demonstrates global hypokinesis. There is moderate concentric left ventricular hypertrophy. Left ventricular diastolic parameters are consistent with Grade I diastolic dysfunction (impaired relaxation).  2. Right ventricular systolic function is normal. The right ventricular size is normal. There is normal pulmonary artery systolic pressure.  3. The mitral valve is degenerative. Mild mitral valve regurgitation.  4. The aortic valve is tricuspid. Aortic valve regurgitation is mild.  5. Aortic dilatation noted. There is mild dilatation of the ascending aorta, measuring 38 mm.  6. The inferior vena cava is normal in size with greater than 50% respiratory variability, suggesting right atrial pressure of 3 mmHg. Comparison(s): Compared to prior echo report (images would not load) in 2016, the LVEF is now mildly reduced at 45-50%. FINDINGS  Left Ventricle: Left ventricular ejection fraction, by estimation, is 45 to 50%. The left ventricle has mildly decreased function. The left ventricle demonstrates global hypokinesis. The left ventricular internal cavity size was normal in size. There is  moderate concentric left ventricular hypertrophy. Left ventricular diastolic parameters are consistent with Grade I diastolic dysfunction (impaired relaxation). Right Ventricle: The right ventricular size is normal. No increase in right ventricular wall thickness. Right ventricular systolic function is normal. There is normal pulmonary artery systolic pressure. The tricuspid regurgitant velocity is 2.57 m/s, and  with an assumed right atrial pressure of 8 mmHg, the estimated right ventricular systolic pressure is 34.4 mmHg. Left Atrium: Left atrial size was normal in size. Right Atrium: Right atrial size was normal in size. Pericardium: There is no evidence of pericardial effusion. Mitral Valve: The mitral valve is degenerative in appearance. There is mild thickening  of the mitral valve leaflet(s). There is mild calcification of the mitral valve leaflet(s). Mild mitral annular calcification. Mild mitral valve regurgitation. Tricuspid  Valve: The tricuspid valve is normal in structure. Tricuspid valve regurgitation is mild. Aortic Valve: The aortic valve is tricuspid. Aortic valve regurgitation is mild. Aortic regurgitation PHT measures 696 msec. Pulmonic Valve: The pulmonic valve was normal in structure. Pulmonic valve regurgitation is mild. Aorta: Aortic dilatation noted. There is mild dilatation of the ascending aorta, measuring 38 mm. Venous: The inferior vena cava is normal in size with greater than 50% respiratory variability, suggesting right atrial pressure of 3 mmHg. IAS/Shunts: No atrial level shunt detected by color flow Doppler.  LEFT VENTRICLE PLAX 2D LVIDd:         3.95 cm     Diastology LVIDs:         3.35 cm     LV e' medial:    3.81 cm/s LV PW:         2.15 cm     LV E/e' medial:  11.7 LV IVS:        1.50 cm     LV e' lateral:   6.36 cm/s LVOT diam:     2.30 cm     LV E/e' lateral: 7.0 LV SV:         106 LV SV Index:   55 LVOT Area:     4.15 cm  LV Volumes (MOD) LV vol d, MOD A2C: 58.5 ml LV vol d, MOD A4C: 78.2 ml LV vol s, MOD A2C: 34.5 ml LV vol s, MOD A4C: 50.9 ml LV SV MOD A2C:     24.0 ml LV SV MOD A4C:     78.2 ml LV SV MOD BP:      26.6 ml IVC IVC diam: 1.50 cm LEFT ATRIUM             Index       RIGHT ATRIUM           Index LA diam:        1.80 cm 0.94 cm/m  RA Area:     12.90 cm LA Vol (A2C):   66.4 ml 34.72 ml/m RA Volume:   27.30 ml  14.28 ml/m LA Vol (A4C):   53.1 ml 27.77 ml/m LA Biplane Vol: 59.9 ml 31.32 ml/m  AORTIC VALVE             PULMONIC VALVE LVOT Vmax:   123.00 cm/s PR End Diast Vel: 1.50 msec LVOT Vmean:  82.500 cm/s LVOT VTI:    0.254 m AI PHT:      696 msec  AORTA Ao Root diam: 4.15 cm Ao Asc diam:  3.80 cm MITRAL VALVE                TRICUSPID VALVE MV Area (PHT): 4.89 cm     TR Peak grad:   26.4 mmHg MV Decel Time: 155 msec      TR Vmax:        257.00 cm/s MV E velocity: 44.70 cm/s MV A velocity: 108.00 cm/s  SHUNTS MV E/A ratio:  0.41         Systemic VTI:  0.25 m                             Systemic Diam: 2.30 cm Laurance Flatten MD Electronically signed by Laurance Flatten MD Signature Date/Time: 02/24/2020/4:23:19 PM    Final         Scheduled Meds: .  stroke: mapping our early stages of recovery book   Does not  apply Once  . vitamin C  500 mg Oral Daily  . [START ON 02/27/2020] aspirin EC  81 mg Oral Daily  . clopidogrel  300 mg Oral Once  . [START ON 02/27/2020] clopidogrel  75 mg Oral Daily  . enoxaparin (LOVENOX) injection  40 mg Subcutaneous Q24H  . insulin aspart  0-15 Units Subcutaneous TID WC  . linagliptin  5 mg Oral Daily  . lisinopril  5 mg Oral Daily  . metoprolol tartrate  12.5 mg Oral BID  . multivitamin with minerals  1 tablet Oral Daily  . senna  1 tablet Oral BID  . vitamin B-12  100 mcg Oral Daily  . zinc sulfate  220 mg Oral Daily   Continuous Infusions:    LOS: 2 days    Time spent: 35 minutes    Davyd Podgorski A Jaccob Czaplicki, MD [facility]   If 7PM-7AM, please contact night-coverage [email or web address]  02/26/2020, 3:12 PM

## 2020-02-26 NOTE — Plan of Care (Signed)
  Problem: Education: Goal: Knowledge of risk factors and measures for prevention of condition will improve Outcome: Progressing   Problem: Coping: Goal: Psychosocial and spiritual needs will be supported Outcome: Progressing   Problem: Respiratory: Goal: Will maintain a patent airway Outcome: Progressing Goal: Complications related to the disease process, condition or treatment will be avoided or minimized Outcome: Progressing   

## 2020-02-26 NOTE — TOC Progression Note (Signed)
Transition of Care [facility]) - Progression Note    Patient Details  Name: Ruth Reynolds MRN: [id number] Date of Birth: 31-Mar-1950  [facility] CM/SW Contact  Beckie Busing, RN Phone Number: [phone]  02/26/2020, 3:56 PM  Clinical Narrative:    CM received message from MD stating that family wanted to speak with CM. CM called niece Bernadene Person who added her mom to the call. Bernadene Person states that she feels that she can no longer care for her aunt and would like to know if the aunt can be placed in long term care. CM has explained that patient will have to take part in the decision making process for rehab. CM explained to family that the patient states she is not sure about rehab and currently is not answering the phone. Niece is agreeable to speak with patient to help her make decisions for her care. CM has attempted to call patient with no answer CM will continue to follow up to determine discharge disposition.    Expected Discharge Plan: Skilled Nursing Facility Barriers to Discharge: Continued Medical Work up  Expected Discharge Plan and Services Expected Discharge Plan: Skilled Nursing Facility In-house Referral: NA Discharge Planning Services: CM Consult   Living arrangements for the past 2 months: Single Family Home                 DME Arranged: N/A DME Agency: NA                   Social Determinants of Health (SDOH) Interventions    Readmission Risk Interventions No flowsheet data found.

## 2020-02-26 NOTE — Progress Notes (Addendum)
   Patient admitted with AKI, UTI, acute metabolic encephalopathy, and COVID infection. Brain MRI consistent with an acute to early subacute small vessel type infarct. Found to have a mildly reduced EF. Echo showed LVEF of 45-50% with global hypokinesis, moderate LVH, grade 1 diastolic dysfunction, and mild MR. Seems to be stable from a CHF standpoint; however, Cardiology asked to give medication recommendations. Discussed with Dr. Mayford Knife. Will stop Lisinopril and start Losartan 25mg  daily. Will switch Lopressor to Toprol-XL 25mg  daily. D-dimer is elevated. Dr. recommends chest CTA to rule out PE. She has never been seen in our office. Therefore, New Patient Visit with Dr. scheduled for 03/24/2020 at 9:40am.  Mayford Knife, PA-C 02/26/2020 3:33 PM

## 2020-02-27 DIAGNOSIS — U071 COVID-19: Secondary | ICD-10-CM | POA: Diagnosis not present

## 2020-02-27 DIAGNOSIS — I6330 Cerebral infarction due to thrombosis of unspecified cerebral artery: Secondary | ICD-10-CM | POA: Diagnosis not present

## 2020-02-27 LAB — C-REACTIVE PROTEIN: CRP: 13.6 mg/dL — ABNORMAL HIGH (ref <1.0)

## 2020-02-27 LAB — CBC WITH DIFFERENTIAL/PLATELET
Abs Immature Granulocytes: 0.03 10*3/uL (ref 0.00–0.07)
Basophils Absolute: 0 10*3/uL (ref 0.0–0.1)
Basophils Relative: 0 %
Eosinophils Absolute: 0 10*3/uL (ref 0.0–0.5)
Eosinophils Relative: 1 %
HCT: 24.1 % — ABNORMAL LOW (ref 36.0–46.0)
Hemoglobin: 7.3 g/dL — ABNORMAL LOW (ref 12.0–15.0)
Immature Granulocytes: 0 %
Lymphocytes Relative: 14 %
Lymphs Abs: 1 10*3/uL (ref 0.7–4.0)
MCH: 22.1 pg — ABNORMAL LOW (ref 26.0–34.0)
MCHC: 30.3 g/dL (ref 30.0–36.0)
MCV: 73 fL — ABNORMAL LOW (ref 80.0–100.0)
Monocytes Absolute: 1 10*3/uL (ref 0.1–1.0)
Monocytes Relative: 14 %
Neutro Abs: 5 10*3/uL (ref 1.7–7.7)
Neutrophils Relative %: 71 %
Platelets: 612 10*3/uL — ABNORMAL HIGH (ref 150–400)
RBC: 3.3 MIL/uL — ABNORMAL LOW (ref 3.87–5.11)
RDW: 20.3 % — ABNORMAL HIGH (ref 11.5–15.5)
WBC: 7.1 10*3/uL (ref 4.0–10.5)
nRBC: 0 % (ref 0.0–0.2)

## 2020-02-27 LAB — LIPID PANEL
Cholesterol: 119 mg/dL (ref 0–200)
HDL: 27 mg/dL — ABNORMAL LOW (ref >40)
LDL Cholesterol: 83 mg/dL (ref 0–99)
Total CHOL/HDL Ratio: 4.4 RATIO
Triglycerides: 46 mg/dL (ref <150)
VLDL: 9 mg/dL (ref 0–40)

## 2020-02-27 LAB — COMPREHENSIVE METABOLIC PANEL WITH GFR
ALT: 10 U/L (ref 0–44)
AST: 13 U/L — ABNORMAL LOW (ref 15–41)
Albumin: 2.2 g/dL — ABNORMAL LOW (ref 3.5–5.0)
Alkaline Phosphatase: 62 U/L (ref 38–126)
Anion gap: 9 (ref 5–15)
BUN: 22 mg/dL (ref 8–23)
CO2: 22 mmol/L (ref 22–32)
Calcium: 8.2 mg/dL — ABNORMAL LOW (ref 8.9–10.3)
Chloride: 105 mmol/L (ref 98–111)
Creatinine, Ser: 1.05 mg/dL — ABNORMAL HIGH (ref 0.44–1.00)
GFR, Estimated: 58 mL/min — ABNORMAL LOW
Glucose, Bld: 160 mg/dL — ABNORMAL HIGH (ref 70–99)
Potassium: 3.8 mmol/L (ref 3.5–5.1)
Sodium: 136 mmol/L (ref 135–145)
Total Bilirubin: 0.3 mg/dL (ref 0.3–1.2)
Total Protein: 6.5 g/dL (ref 6.5–8.1)

## 2020-02-27 LAB — MAGNESIUM: Magnesium: 2 mg/dL (ref 1.7–2.4)

## 2020-02-27 LAB — GLUCOSE, CAPILLARY
Glucose-Capillary: 132 mg/dL — ABNORMAL HIGH (ref 70–99)
Glucose-Capillary: 267 mg/dL — ABNORMAL HIGH (ref 70–99)
Glucose-Capillary: 108 mg/dL — ABNORMAL HIGH (ref 70–99)
Glucose-Capillary: 160 mg/dL — ABNORMAL HIGH (ref 70–99)

## 2020-02-27 LAB — D-DIMER, QUANTITATIVE: D-Dimer, Quant: 2.22 ug/mL-FEU — ABNORMAL HIGH (ref 0.00–0.50)

## 2020-02-27 LAB — PHOSPHORUS: Phosphorus: 4.2 mg/dL (ref 2.5–4.6)

## 2020-02-27 MED ORDER — ATORVASTATIN CALCIUM 40 MG PO TABS
40.0000 mg | ORAL_TABLET | Freq: Every evening | ORAL | Status: DC
  Administered 2020-02-27 – 2020-03-10 (×14): 40 mg via ORAL
  Filled 2020-02-27 (×14): qty 1

## 2020-02-27 NOTE — Progress Notes (Signed)
**Note De-Identified Ruth Reynolds** Physical Therapy Treatment Patient Details Name: Ruth Reynolds MRN: [id number] DOB: [birth year] Today's Date: 02/27/2020    History of Present Illness Pt is a 70 y/o female admitted after being on floor for a day, pt states she fell then slept there and refused to get up. Found to be COVID + with encephalopathy. PMH includes DM, anemia, CHF, and SDH.    PT Comments    Pt in bed on arrival stating she doesn't know why she stayed on the floor at home but does report she fell prior to sleeping on the floor. Pt with improved mobility from eval with maintained cognitive deficits but improved awareness and command following. Pt continues to have limited activity tolerance with plan for SNF still appropriate. Will continue to follow.     Follow Up Recommendations  SNF     Equipment Recommendations  Rolling walker with 5" wheels    Recommendations for Other Services       Precautions / Restrictions Precautions Precautions: Fall    Mobility  Bed Mobility Overal bed mobility: Needs Assistance Bed Mobility: Supine to Sit     Supine to sit: Supervision     General bed mobility comments: pt able to transition from supine to sit without physical assist with increased time  Transfers Overall transfer level: Needs assistance   Transfers: Sit to/from Stand Sit to Stand: Min guard         General transfer comment: cues for hand placement and guarding for stability  Ambulation/Gait Ambulation/Gait assistance: Min guard Gait Distance (Feet): 40 Feet Assistive device: Rolling walker (2 wheeled) Gait Pattern/deviations: Step-through pattern;Decreased stride length;Trunk flexed   Gait velocity interpretation: 1.31 - 2.62 ft/sec, indicative of limited community ambulator General Gait Details: cues for posture, position in RW and safety with pt  able to make 2 laps in room but declined hall ambulation   Stairs             Wheelchair Mobility    Modified Rankin (Stroke  Patients Only)       Balance Overall balance assessment: Needs assistance   Sitting balance-Leahy Scale: Fair Sitting balance - Comments: EOB without UE support   Standing balance support: Bilateral upper extremity supported;During functional activity Standing balance-Leahy Scale: Poor Standing balance comment: RW for standing and gait                            Cognition Arousal/Alertness: Awake/alert Behavior During Therapy: Flat affect Overall Cognitive Status: No family/caregiver present to determine baseline cognitive functioning Area of Impairment: Attention;Following commands;Awareness;Problem solving;Orientation                 Orientation Level: Time Current Attention Level: Sustained   Following Commands: Follows one step commands consistently     Problem Solving: Slow processing General Comments: pt stating she has no where to go as she wants to find her own place to live, no recognition of concern for situation causing admission, fall or limited function      Exercises General Exercises - Lower Extremity Long Arc Quad: AROM;Both;Seated;20 reps Hip Flexion/Marching: AROM;Both;Seated;10 reps    General Comments        Pertinent Vitals/Pain Pain Assessment: No/denies pain    Home Living     Available Help at Discharge: Family;Available 24 hours/day Type of Home: House              Prior Function  PT Goals (current goals can now be found in the care plan section) Progress towards PT goals: Progressing toward goals    Frequency    Min 2X/week      PT Plan Current plan remains appropriate    Co-evaluation              AM-PAC PT "6 Clicks" Mobility   Outcome Measure  Help needed turning from your back to your side while in a flat bed without using bedrails?: A Little Help needed moving from lying on your back to sitting on the side of a flat bed without using bedrails?: A Little Help needed moving to  and from a bed to a chair (including a wheelchair)?: A Little Help needed standing up from a chair using your arms (e.g., wheelchair or bedside chair)?: A Little Help needed to walk in hospital room?: A Little Help needed climbing 3-5 steps with a railing? : A Lot 6 Click Score: 17    End of Session Equipment Utilized During Treatment: Gait belt Activity Tolerance: Patient tolerated treatment well Patient left: with call bell/phone within reach;in chair;with chair alarm set Nurse Communication: Mobility status PT Visit Diagnosis: Unsteadiness on feet (R26.81);Muscle weakness (generalized) (M62.81);History of falling (Z91.81)     Time: 8882-8003 PT Time Calculation (min) (ACUTE ONLY): 22 min  Charges:  $Gait Training: 8-22 mins                     Merryl Hacker, PT Acute Rehabilitation Services Pager: [phone] Office: [phone]    [name] B [name] 02/27/2020, 1:27 PM

## 2020-02-27 NOTE — Progress Notes (Signed)
STROKE TEAM PROGRESS NOTE   SUBJECTIVE (INTERVAL HISTORY) No family is at the bedside.  Overall her condition is stable.  Patient sitting in chair, lethargic but awake alert answer questions appropriately.  No significant focal deficit seen.  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   OBJECTIVE Temp:  [97.8 F (36.6 C)-98.7 F (37.1 C)] 98.1 F (36.7 C) (01/27 0634) Pulse Rate:  [72-88] 72 (01/27 0634) Resp:  [16-20] 16 (01/27 0634) BP: (122-129)/(65-73) 125/68 (01/27 0634) SpO2:  [96 %-100 %] 99 % (01/27 0634)  Recent Labs  Lab 02/26/20 1153 02/26/20 1548 02/26/20 2143 02/27/20 0726 02/27/20 1136  GLUCAP 89 124* 207* 132* 267*   Recent Labs  Lab 02/24/20 0135 02/24/20 0524 02/25/20 0500 02/26/20 0412 02/27/20 0500  NA 135 135 134* 137 136  K 4.3 4.0 4.3 4.1 3.8  CL 102 103 102 104 105  CO2 24 22 22 24 22   GLUCOSE 245* 119* 171* 91 160*  BUN 23 21 20 18 22   CREATININE 1.10* 0.99 1.06* 0.89 1.05*  CALCIUM 8.4* 8.1* 8.3* 8.5* 8.2*  MG  --   --  1.9 2.0 2.0  PHOS  --   --  4.2 4.2 4.2   Recent Labs  Lab 02/23/20 0954 02/25/20 0500 02/26/20 0412 02/27/20 0500  AST 18 18 14* 13*  ALT 11 11 11 10   ALKPHOS 72 65 64 62  BILITOT 0.5 0.2* 0.5 0.3  PROT 8.2* 7.0 6.7 6.5  ALBUMIN 2.9* 2.2* 2.2* 2.2*   Recent Labs  Lab 02/24/20 0135 02/24/20 0524 02/25/20 0500 02/26/20 0412 02/27/20 0500  WBC 9.3 8.5 6.2 6.9 7.1  NEUTROABS  --   --  4.1 5.4 5.0  HGB 8.2* 7.3* 7.5* 7.2* 7.3*  HCT 26.1* 23.5* 25.2* 22.5* 24.1*  MCV 72.3* 72.5* 75.0* 71.4* 73.0*  PLT 507* 461* 421* 490* 612*   Recent Labs  Lab 02/23/20 0954  CKTOTAL 130   No results for input(s): LABPROT, INR in the last 72 hours. No results for input(s): COLORURINE, LABSPEC, PHURINE, GLUCOSEU, HGBUR, BILIRUBINUR, KETONESUR, PROTEINUR, UROBILINOGEN, NITRITE, LEUKOCYTESUR in the last 72 hours.  Invalid input(s): APPERANCEUR     Component Value Date/Time   CHOL 119 02/27/2020 0500    TRIG 46 02/27/2020 0500   HDL 27 (L) 02/27/2020 0500   CHOLHDL 4.4 02/27/2020 0500   VLDL 9 02/27/2020 0500   LDLCALC 83 02/27/2020 0500   Lab Results  Component Value Date   HGBA1C 9.8 (H) 02/23/2020   No results found for: LABOPIA, COCAINSCRNUR, LABBENZ, AMPHETMU, THCU, LABBARB  No results for input(s): ETH in the last 168 hours.  I have personally reviewed the radiological images below and agree with the radiology interpretations.  CT Code Stroke CTA Head W/WO contrast  Result Date: 02/26/2020 CLINICAL DATA:  Stroke. EXAM: CT ANGIOGRAPHY HEAD AND NECK TECHNIQUE: Multidetector CT imaging of the head and neck was performed using the standard protocol during bolus administration of intravenous contrast. Multiplanar CT image reconstructions and MIPs were obtained to evaluate the vascular anatomy. Carotid stenosis measurements (when applicable) are obtained utilizing NASCET criteria, using the distal internal carotid diameter as the denominator. CONTRAST:  76mL OMNIPAQUE IOHEXOL 350 MG/ML SOLN COMPARISON:  Head MRI 02/25/2020 FINDINGS: CT HEAD FINDINGS Brain: The subcentimeter acute to early subacute right pontine infarct on MRI is not visible by CT. No acute cortically based infarct, intracranial hemorrhage, mass, midline shift, or extra-axial fluid collection is identified. Confluent hypodensities in the cerebral white matter bilaterally are  nonspecific but compatible with severe chronic small vessel ischemic disease. There are chronic lacunar infarcts in the thalami. Vascular: Calcified atherosclerosis at the skull base. Skull: No fracture or suspicious osseous lesion. Sinuses: Mild scattered mucosal thickening in the paranasal sinuses. Clear mastoid air cells. Orbits: Unremarkable. Review of the MIP images confirms the above findings CTA NECK FINDINGS Aortic arch: Standard 3 vessel aortic arch with mild atherosclerotic plaque. No arch vessel origin stenosis. Right carotid system: Patent with mild  calcified and soft plaque at the carotid bifurcation. No evidence of significant stenosis or dissection. Tortuous mid cervical ICA. Beading of the mid and distal cervical ICA. Left carotid system: Patent with mild calcified and soft plaque at the carotid bifurcation. No evidence of significant stenosis or dissection. Tortuous mid cervical ICA. Beading of the mid and distal cervical ICA. 4 mm laterally projecting outpouching from the ICA just below the skull base suggestive of a small pseudoaneurysm. Vertebral arteries: Patent without evidence of significant stenosis or dissection. Strongly dominant left vertebral artery. Skeleton: Advanced disc degeneration from C4-5 to C6-7. Other neck: No evidence of cervical lymphadenopathy or mass. Upper chest: Partially visualized small area of peripheral ground-glass opacity laterally in the right upper lobe. Review of the MIP images confirms the above findings CTA HEAD FINDINGS Anterior circulation: The internal carotid arteries are patent from skull base to carotid termini with mild atherosclerotic plaque bilaterally not resulting in significant stenosis. There is a 2 mm aneurysm projecting inferiorly from the left supraclinoid ICA in the posterior communicating region. ACAs and MCAs are patent without evidence of a proximal branch occlusion. There is mild irregularity of the M1 segments and MCA branch vessels bilaterally without a flow limiting proximal stenosis, and there is moderate irregular narrowing of distal ACA branch vessels. Posterior circulation: The intracranial vertebral arteries are patent to the basilar with atherosclerotic plaque on the left resulting in luminal irregularity but no significant stenosis. The basilar artery is widely patent. Posterior communicating arteries are diminutive or absent. The PCAs are patent with mild distal branch vessel irregularity bilaterally as well as mild multifocal stenosis of the left P2 segment. No aneurysm is identified.  Venous sinuses: As permitted by contrast timing, patent. Anatomic variants: None. Review of the MIP images confirms the above findings IMPRESSION: 1. Intracranial atherosclerosis without a medium or large vessel occlusion or flow limiting proximal stenosis. 2. 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 4. Partially visualized small area of pulmonary ground-glass opacity laterally in the right upper lobe, nonspecific and incompletely evaluated though likely reflecting known COVID-19 infection. 5. Aortic Atherosclerosis (ICD10-I70.0). Electronically Signed   By: Sebastian Ache M.D.   On: 02/26/2020 21:10   CT Head Wo Contrast  Result Date: 02/23/2020 CLINICAL DATA:  Neck trauma. EXAM: CT HEAD WITHOUT CONTRAST CT CERVICAL SPINE WITHOUT CONTRAST TECHNIQUE: Multidetector CT imaging of the head and cervical spine was performed following the standard protocol without intravenous contrast. Multiplanar CT image reconstructions of the cervical spine were also generated. COMPARISON:  None. FINDINGS: CT HEAD FINDINGS Brain: No evidence of acute infarction, hemorrhage, hydrocephalus, extra-axial collection or mass lesion/mass effect. There is chronic diffuse atrophy. Chronic bilateral periventricular white matter small vessel ischemic changes noted. Vascular: No hyperdense vessel is noted. Skull: Normal. Negative for fracture or focal lesion. Sinuses/Orbits: No acute finding. Other: None. CT CERVICAL SPINE FINDINGS Alignment: Normal. Skull base and vertebrae: No acute  fracture. No primary bone lesion or focal pathologic process. Soft tissues and spinal canal: No prevertebral fluid or swelling. No visible canal hematoma. Disc levels: Degenerative joint changes with narrowed joint space and osteophyte formation identified mid to lower cervical spine. Upper chest: Negative. Other:  None. IMPRESSION: 1. No focal acute intracranial abnormality identified. 2. Chronic diffuse atrophy and chronic bilateral periventricular white matter small vessel ischemic change. 3. No acute fracture or dislocation of cervical spine. 4. Degenerative joint changes of cervical spine. Electronically Signed   By: Sherian Rein M.D.   On: 02/23/2020 08:44   CT Code Stroke CTA Neck W/WO contrast  Result Date: 02/26/2020 CLINICAL DATA:  Stroke. EXAM: CT ANGIOGRAPHY HEAD AND NECK TECHNIQUE: Multidetector CT imaging of the head and neck was performed using the standard protocol during bolus administration of intravenous contrast. Multiplanar CT image reconstructions and MIPs were obtained to evaluate the vascular anatomy. Carotid stenosis measurements (when applicable) are obtained utilizing NASCET criteria, using the distal internal carotid diameter as the denominator. CONTRAST:  29mL OMNIPAQUE IOHEXOL 350 MG/ML SOLN COMPARISON:  Head MRI 02/25/2020 FINDINGS: CT HEAD FINDINGS Brain: The subcentimeter acute to early subacute right pontine infarct on MRI is not visible by CT. No acute cortically based infarct, intracranial hemorrhage, mass, midline shift, or extra-axial fluid collection is identified. Confluent hypodensities in the cerebral white matter bilaterally are nonspecific but compatible with severe chronic small vessel ischemic disease. There are chronic lacunar infarcts in the thalami. Vascular: Calcified atherosclerosis at the skull base. Skull: No fracture or suspicious osseous lesion. Sinuses: Mild scattered mucosal thickening in the paranasal sinuses. Clear mastoid air cells. Orbits: Unremarkable. Review of the MIP images confirms the above findings CTA NECK FINDINGS Aortic arch: Standard 3 vessel aortic arch with mild atherosclerotic plaque. No arch vessel origin stenosis. Right carotid system: Patent with mild calcified and soft plaque at the carotid bifurcation. No evidence of significant stenosis or  dissection. Tortuous mid cervical ICA. Beading of the mid and distal cervical ICA. Left carotid system: Patent with mild calcified and soft plaque at the carotid bifurcation. No evidence of significant stenosis or dissection. Tortuous mid cervical ICA. Beading of the mid and distal cervical ICA. 4 mm laterally projecting outpouching from the ICA just below the skull base suggestive of a small pseudoaneurysm. Vertebral arteries: Patent without evidence of significant stenosis or dissection. Strongly dominant left vertebral artery. Skeleton: Advanced disc degeneration from C4-5 to C6-7. Other neck: No evidence of cervical lymphadenopathy or mass. Upper chest: Partially visualized small area of peripheral ground-glass opacity laterally in the right upper lobe. Review of the MIP images confirms the above findings CTA HEAD FINDINGS Anterior circulation: The internal carotid arteries are patent from skull base to carotid termini with mild atherosclerotic plaque bilaterally not resulting in significant stenosis. There is a 2 mm aneurysm projecting inferiorly from the left supraclinoid ICA in the posterior communicating region. ACAs and MCAs are patent without evidence of a proximal branch occlusion. There is mild irregularity of the M1 segments and MCA branch vessels bilaterally without a flow limiting proximal stenosis, and there is moderate irregular narrowing of distal ACA branch vessels. Posterior circulation: The intracranial vertebral arteries are patent to the basilar with atherosclerotic plaque on the left resulting in luminal irregularity but no significant stenosis. The basilar artery is widely patent. Posterior communicating arteries are diminutive or absent. The PCAs are patent with mild distal branch vessel irregularity bilaterally as well as mild multifocal stenosis of the left P2 segment. No aneurysm  is identified. Venous sinuses: As permitted by contrast timing, patent. Anatomic variants: None. Review of  the MIP images confirms the above findings IMPRESSION: 1. Intracranial atherosclerosis without a medium or large vessel occlusion or flow limiting proximal stenosis. 2. 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 4. Partially visualized small area of pulmonary ground-glass opacity laterally in the right upper lobe, nonspecific and incompletely evaluated though likely reflecting known COVID-19 infection. 5. Aortic Atherosclerosis (ICD10-I70.0). Electronically Signed   By: Sebastian Ache M.D.   On: 02/26/2020 21:10   CT Cervical Spine Wo Contrast  Result Date: 02/23/2020 CLINICAL DATA:  Neck trauma. EXAM: CT HEAD WITHOUT CONTRAST CT CERVICAL SPINE WITHOUT CONTRAST TECHNIQUE: Multidetector CT imaging of the head and cervical spine was performed following the standard protocol without intravenous contrast. Multiplanar CT image reconstructions of the cervical spine were also generated. COMPARISON:  None. FINDINGS: CT HEAD FINDINGS Brain: No evidence of acute infarction, hemorrhage, hydrocephalus, extra-axial collection or mass lesion/mass effect. There is chronic diffuse atrophy. Chronic bilateral periventricular white matter small vessel ischemic changes noted. Vascular: No hyperdense vessel is noted. Skull: Normal. Negative for fracture or focal lesion. Sinuses/Orbits: No acute finding. Other: None. CT CERVICAL SPINE FINDINGS Alignment: Normal. Skull base and vertebrae: No acute fracture. No primary bone lesion or focal pathologic process. Soft tissues and spinal canal: No prevertebral fluid or swelling. No visible canal hematoma. Disc levels: Degenerative joint changes with narrowed joint space and osteophyte formation identified mid to lower cervical spine. Upper chest: Negative. Other: None. IMPRESSION: 1. No focal acute intracranial abnormality identified. 2.  Chronic diffuse atrophy and chronic bilateral periventricular white matter small vessel ischemic change. 3. No acute fracture or dislocation of cervical spine. 4. Degenerative joint changes of cervical spine. Electronically Signed   By: Sherian Rein M.D.   On: 02/23/2020 08:44   MR BRAIN WO CONTRAST  Result Date: 02/26/2020 CLINICAL DATA:  Initial evaluation for acute delirium, found down. EXAM: MRI HEAD WITHOUT CONTRAST TECHNIQUE: Multiplanar, multiecho pulse sequences of the brain and surrounding structures were obtained without intravenous contrast. COMPARISON:  Prior CT from 02/23/2020. FINDINGS: Brain: Diffuse prominence of the CSF containing spaces compatible with generalized age-related cerebral atrophy. Extensive patchy and confluent T2/FLAIR hyperintensity throughout the periventricular and deep white matter both cerebral hemispheres as well as the pons total most consistent with chronic microvascular ischemic disease, advanced in nature. Multiple superimposed remote lacunar infarcts present about the periventricular white matter of the corona radiata as well as the deep gray nuclei. 7 mm focus of mild diffusion abnormality seen involving the right paramedian pons, consistent with an acute to subacute small vessel type infarct (series 5, image 61). No associated hemorrhage or mass effect. No other foci of diffusion abnormality to suggest acute or subacute ischemia. Gray-white matter differentiation otherwise maintained. No encephalomalacia to suggest chronic cortical infarction elsewhere within the brain. No other foci of susceptibility artifact to suggest acute or chronic intracranial hemorrhage. No mass lesion, midline shift or mass effect. Mild diffuse ventricular prominence related to global parenchymal volume loss without hydrocephalus. No made of a partially empty sella. No extra-axial fluid collection. Midline structures intact. Vascular: Major intracranial vascular flow voids are maintained.  Strongly dominant left vertebral artery noted. Skull and upper cervical spine: Craniocervical junction within normal limits. Upper cervical spine demonstrates no acute finding. Bone marrow signal intensity within normal limits. Scalp soft tissues  unremarkable. Sinuses/Orbits: Globes and orbital soft tissues within normal limits. Mild-to-moderate mucosal thickening noted throughout the paranasal sinuses, likely allergic/inflammatory nature. Mastoid air cells are largely clear. Other: None. IMPRESSION: 1. 7 mm focus of diffusion abnormality involving the right paramedian pons, consistent with an acute to early subacute small vessel type infarct. No associated hemorrhage or mass effect. 2. No other acute intracranial abnormality. 3. Age-related cerebral atrophy with advanced chronic microvascular ischemic disease, with multiple superimposed remote lacunar infarcts involving the hemispheric cerebral white matter and deep gray nuclei. Electronically Signed   By: Rise Mu M.D.   On: 02/26/2020 00:24   DG Chest Port 1 View  Result Date: 02/24/2020 CLINICAL DATA:  COVID-19 positive.  Weakness. EXAM: PORTABLE CHEST 1 VIEW COMPARISON:  None. FINDINGS: There is a subtle area of airspace opacity in the lateral right base region. Lungs elsewhere clear. Heart is borderline enlarged with pulmonary vascularity normal. No adenopathy. There is aortic atherosclerosis. No bone lesions. IMPRESSION: Subtle airspace opacity lateral right base concerning for focus of developing pneumonia. Atypical organism pneumonia could present in this manner. Lungs otherwise clear. Heart borderline enlarged. Aortic Atherosclerosis (ICD10-I70.0). Electronically Signed   By: Bretta Bang III M.D.   On: 02/24/2020 10:31   ECHOCARDIOGRAM COMPLETE  Result Date: 02/24/2020    ECHOCARDIOGRAM REPORT   Patient Name:   JAMYAH FOLK Date of Exam: 02/24/2020 Medical Rec #:  [id number]   Height:       66.0 in Accession #:    [id number]  Weight:        180.0 lb Date of Birth:  01-30-1951    BSA:          1.912 m Patient Age:    69 years    BP:           122/62 mmHg Patient Gender: F           HR:           85 bpm. Exam Location:  Inpatient Procedure: 2D Echo, 3D Echo, Cardiac Doppler and Color Doppler Indications:    I50.9* Heart failure (unspecified)  History:        Patient has prior history of Echocardiogram examinations, most                 recent 11/12/2018. Stroke, Signs/Symptoms:Syncope; Risk                 Factors:Diabetes. Covid positive.  Sonographer:    Sheralyn Boatman RDCS Referring Phys: 13 MICHAEL E NORINS  Sonographer Comments: Technically difficult study due to poor echo windows. IMPRESSIONS  1. Left ventricular ejection fraction, by estimation, is 45 to 50%. The left ventricle has mildly decreased function. The left ventricle demonstrates global hypokinesis. There is moderate concentric left ventricular hypertrophy. Left ventricular diastolic parameters are consistent with Grade I diastolic dysfunction (impaired relaxation).  2. Right ventricular systolic function is normal. The right ventricular size is normal. There is normal pulmonary artery systolic pressure.  3. The mitral valve is degenerative. Mild mitral valve regurgitation.  4. The aortic valve is tricuspid. Aortic valve regurgitation is mild.  5. Aortic dilatation noted. There is mild dilatation of the ascending aorta, measuring 38 mm.  6. The inferior vena cava is normal in size with greater than 50% respiratory variability, suggesting right atrial pressure of 3 mmHg. Comparison(s): Compared to prior echo report (images would not load) in 2016, the LVEF is now mildly reduced at 45-50%. FINDINGS  Left Ventricle: Left ventricular ejection fraction, by  estimation, is 45 to 50%. The left ventricle has mildly decreased function. The left ventricle demonstrates global hypokinesis. The left ventricular internal cavity size was normal in size. There is  moderate concentric left ventricular  hypertrophy. Left ventricular diastolic parameters are consistent with Grade I diastolic dysfunction (impaired relaxation). Right Ventricle: The right ventricular size is normal. No increase in right ventricular wall thickness. Right ventricular systolic function is normal. There is normal pulmonary artery systolic pressure. The tricuspid regurgitant velocity is 2.57 m/s, and  with an assumed right atrial pressure of 8 mmHg, the estimated right ventricular systolic pressure is 34.4 mmHg. Left Atrium: Left atrial size was normal in size. Right Atrium: Right atrial size was normal in size. Pericardium: There is no evidence of pericardial effusion. Mitral Valve: The mitral valve is degenerative in appearance. There is mild thickening of the mitral valve leaflet(s). There is mild calcification of the mitral valve leaflet(s). Mild mitral annular calcification. Mild mitral valve regurgitation. Tricuspid Valve: The tricuspid valve is normal in structure. Tricuspid valve regurgitation is mild. Aortic Valve: The aortic valve is tricuspid. Aortic valve regurgitation is mild. Aortic regurgitation PHT measures 696 msec. Pulmonic Valve: The pulmonic valve was normal in structure. Pulmonic valve regurgitation is mild. Aorta: Aortic dilatation noted. There is mild dilatation of the ascending aorta, measuring 38 mm. Venous: The inferior vena cava is normal in size with greater than 50% respiratory variability, suggesting right atrial pressure of 3 mmHg. IAS/Shunts: No atrial level shunt detected by color flow Doppler.  LEFT VENTRICLE PLAX 2D LVIDd:         3.95 cm     Diastology LVIDs:         3.35 cm     LV e' medial:    3.81 cm/s LV PW:         2.15 cm     LV E/e' medial:  11.7 LV IVS:        1.50 cm     LV e' lateral:   6.36 cm/s LVOT diam:     2.30 cm     LV E/e' lateral: 7.0 LV SV:         106 LV SV Index:   55 LVOT Area:     4.15 cm  LV Volumes (MOD) LV vol d, MOD A2C: 58.5 ml LV vol d, MOD A4C: 78.2 ml LV vol s, MOD A2C:  34.5 ml LV vol s, MOD A4C: 50.9 ml LV SV MOD A2C:     24.0 ml LV SV MOD A4C:     78.2 ml LV SV MOD BP:      26.6 ml IVC IVC diam: 1.50 cm LEFT ATRIUM             Index       RIGHT ATRIUM           Index LA diam:        1.80 cm 0.94 cm/m  RA Area:     12.90 cm LA Vol (A2C):   66.4 ml 34.72 ml/m RA Volume:   27.30 ml  14.28 ml/m LA Vol (A4C):   53.1 ml 27.77 ml/m LA Biplane Vol: 59.9 ml 31.32 ml/m  AORTIC VALVE             PULMONIC VALVE LVOT Vmax:   123.00 cm/s PR End Diast Vel: 1.50 msec LVOT Vmean:  82.500 cm/s LVOT VTI:    0.254 m AI PHT:      696 msec  AORTA Ao Root  diam: 4.15 cm Ao Asc diam:  3.80 cm MITRAL VALVE                TRICUSPID VALVE MV Area (PHT): 4.89 cm     TR Peak grad:   26.4 mmHg MV Decel Time: 155 msec     TR Vmax:        257.00 cm/s MV E velocity: 44.70 cm/s MV A velocity: 108.00 cm/s  SHUNTS MV E/A ratio:  0.41         Systemic VTI:  0.25 m                             Systemic Diam: 2.30 cm Laurance Flatten MD Electronically signed by Laurance Flatten MD Signature Date/Time: 02/24/2020/4:23:19 PM    Final     PHYSICAL EXAM  Temp:  [97.8 F (36.6 C)-98.7 F (37.1 C)] 98.1 F (36.7 C) (01/27 0634) Pulse Rate:  [72-88] 72 (01/27 0634) Resp:  [16-20] 16 (01/27 0634) BP: (122-129)/(65-73) 125/68 (01/27 0634) SpO2:  [96 %-100 %] 99 % (01/27 0634)  General - Well nourished, well developed, in no apparent distress, lethargic.  Ophthalmologic - fundi not visualized due to noncooperation.  Cardiovascular - Regular rhythm and rate.  Mental Status -  Level of arousal and orientation to place, age and person were intact, however not able to tell me month or year. Language including expression, naming, repetition, comprehension was assessed and found intact.  Cranial Nerves II - XII - II - Visual field intact OU. III, IV, VI - Extraocular movements intact. V - Facial sensation intact bilaterally. VII - slight left facial nasolabial fold flattening VIII - Hearing &  vestibular intact bilaterally. X - Palate elevates symmetrically. XI - Chin turning & shoulder shrug intact bilaterally. XII - Tongue protrusion intact.  Motor Strength - The patient's strength was symmetrical in all extremities and pronator drift was absent.  Bulk was normal and fasciculations were absent.   Motor Tone - Muscle tone was assessed at the neck and appendages and was normal.  Reflexes - The patient's reflexes were symmetrical in all extremities and she had no pathological reflexes.  Sensory - Light touch, temperature/pinprick were assessed and were symmetrical.    Coordination - The patient had normal movements in the hands with no ataxia or dysmetria.  Tremor was absent.  Gait and Station - deferred.   ASSESSMENT/PLAN Ruth Reynolds is a 70 y.o. female with history of DM, HTN, SDH 2019 status post fall, CHF admitted for found down at home with generalized weakness and fatigue. No tPA given due to outside window.  Found to have Covid positive, put on remdesivir.  UTI treated with Cipro.  A1c 9.8, indicating uncontrolled diabetes, on Lantus and SSI.  MRI found pontomedullary small infarct.  Neurology consulted.  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  Multiple superimposed remote lacunar infarcts involving hemispheric white matter and deep gray nuclei.  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  2 mm left supraclinoid ICA aneurysm.  2D Echo EF 45 to 50%  LDL 83  HgbA1c 9.8  Lovenox for VTE prophylaxis  No antithrombotic prior to admission, now on aspirin 81 mg daily and clopidogrel 75 mg daily DAPT for 3 weeks and then aspirin alone.  Ongoing aggressive stroke risk factor management  Therapy recommendations: SNF  Disposition: Pending  Covid infection  Covid  PCR positive  CTA neck partially visualized small area of pulmonary groundglass opacity laterally in the right upper lobe, nonspecific and incompletely evaluated though likely reflecting known COVID-19 infection  On remdesivir  On zinc and vitamin C  Diabetes  HgbA1c 9.8 goal < 7.0  Uncontrolled  Currently on Tradjenta  CBG monitoring  SSI  DM education and close PCP follow up  Hypertension . Stable  Long term BP goal normotensive  Hyperlipidemia  Home meds: None  LDL 83, goal < 70  Now on Lipitor 40  Continue statin at discharge  Other Stroke Risk Factors  Advanced age  ETOH use  CHF  Other Active Problems  SDH 2019 status post fall  Hospital day # 3  Neurology will sign off. Please call with questions. [facility] weeks. Thanks for the consult.   Marvel PlanJindong Blaise Palladino, MD PhD Stroke Neurology 02/27/2020 12:54 PM    To contact Stroke Continuity provider, please refer to [email or web address]. After hours, contact General Neurology

## 2020-02-27 NOTE — Evaluation (Signed)
Speech Language Pathology Evaluation Patient Details Name: Ruth Reynolds MRN: [id number] DOB: 1950/04/06 Today's Date: 02/27/2020 Time: 1030-1100 SLP Time Calculation (min) (ACUTE ONLY): 30 min  Problem List:  Patient Active Problem List   Diagnosis Date Noted  . Cerebral thrombosis with cerebral infarction 02/26/2020  . Generalized weakness   . COVID-19 virus infection   . Hyperglycemia due to diabetes mellitus (HCC) 02/23/2020  . Acute prerenal azotemia 02/23/2020  . Acute metabolic encephalopathy 02/23/2020  . Acute lower UTI 02/23/2020  . (HFpEF) heart failure with preserved ejection fraction (HCC) 02/23/2020  . Syncope 11/11/2014  . CKD (chronic kidney disease) 11/11/2014  . Symptomatic anemia 11/11/2014   Past Medical History:  Past Medical History:  Diagnosis Date  . (HFpEF) heart failure with preserved ejection fraction (HCC) 2016   Echo with EF 55-60%, Grade II diastolic dysfxn  . DM2 (diabetes mellitus, type 2) (HCC)   . Iron deficiency anemia   . SDH (subdural hematoma) (HCC) 2019   after fall. Left. No sequellae   Past Surgical History:  Past Surgical History:  Procedure Laterality Date  . ABDOMINAL HYSTERECTOMY    . ABDOMINAL HYSTERECTOMY     HPI:  70yo female admitted 02/23/20 from home after being found down. Found to be Covid +, acute metabolic encephalopathy, AKI, UTI. PMH: DM2, chronic anemia, SDH after fall 2019, HFpEF. MRI = acute to early subacute small vessel infarct.   Assessment / Plan / Recommendation Clinical Impression  The Mini-Mental State Examination (MMSE) was administered. Pt scored 22/30 (n=25+/30), raising concern for mild cognitive deficits. Points were lost on orientation to day/date, city, hospital, attention (spelling WORLD backwards), delayed recall (pt recalled 1/3 words after a delay), and following multistep commands. Pt reports she was independent with bill paying, medication management, and cooking/cleaning prior to admit. No family  was present to discuss previous level of function or confirm prior independence. Based on results of today's evaluation, continued ST intervention is recommended at the next venue of care to maximize safety and independence, and reduce caregiver burden.    SLP Assessment  SLP Recommendation/Assessment: All further Speech Language Pathology needs can be addressed in the next venue of care  SLP Visit Diagnosis: Cognitive communication deficit (R41.841)    Follow Up Recommendations  Skilled Nursing facility       SLP Evaluation Cognition  Overall Cognitive Status: No family/caregiver present to determine baseline cognitive functioning Arousal/Alertness: Awake/alert Orientation Level: Oriented to person (points lost on MMSE for orientation to day/date, city, and hospital) Attention: Focused;Sustained Focused Attention: Appears intact Sustained Attention: Impaired Memory: Impaired Memory Impairment: Retrieval deficit;Decreased recall of new information;Decreased short term memory Decreased Short Term Memory: Verbal basic Awareness: Impaired       Comprehension  Auditory Comprehension Overall Auditory Comprehension: Appears within functional limits for tasks assessed Reading Comprehension Reading Status: Within funtional limits    Expression Expression Primary Mode of Expression: Verbal Verbal Expression Overall Verbal Expression: Appears within functional limits for tasks assessed Written Expression Dominant Hand: Right Written Expression: Within Functional Limits   Oral / Motor  Oral Motor/Sensory Function Overall Oral Motor/Sensory Function: Within functional limits Motor Speech Overall Motor Speech: Appears within functional limits for tasks assessed   GO                   Celia B. Murvin Natal, [facility], CCC-SLP Speech Language Pathologist Office: [phone] Pager: [phone]  Leigh Aurora 02/27/2020, 12:14 PM

## 2020-02-27 NOTE — TOC Progression Note (Addendum)
Transition of Care [facility]) - Progression Note    Patient Details  Name: Ruth Reynolds MRN: [id number] Date of Birth: 01/22/1951  [facility] CM/SW Contact  Beckie Busing, RN Phone Number: [phone]  02/27/2020, 9:36 AM  Clinical Narrative:    CM has made several attempts to call patients room and cell phone, unable to reach patient. Message has been sent to nurse to place phone within reach. CM will attempt to call patient at another time to discuss discharge disposition.   1000 CM spoke with patient. Patient is agreeable to rehab. Patient gives CM consent to fax info out to facilities for bed offers.    Expected Discharge Plan: Skilled Nursing Facility Barriers to Discharge: Continued Medical Work up  Expected Discharge Plan and Services Expected Discharge Plan: Skilled Nursing Facility In-house Referral: NA Discharge Planning Services: CM Consult   Living arrangements for the past 2 months: Single Family Home                 DME Arranged: N/A DME Agency: NA                   Social Determinants of Health (SDOH) Interventions    Readmission Risk Interventions No flowsheet data found.

## 2020-02-27 NOTE — NC FL2 (Signed)
MEDICAID [id number] LEVEL OF CARE SCREENING TOOL     IDENTIFICATION  Patient Name: Ruth Reynolds Birthdate: 04-30-1950 Sex: female Admission Date (Current Location): 02/23/2020  [facility] and [place] Number:  Producer, television/film/video and Address:  [facility] [place], [address] [place], [place] [address]      Provider Number:    Attending Physician Name and Address:  Alba Cory, MD  Relative Name and Phone Number:  Knox Royalty [phone]    Current Level of Care: Hospital Recommended Level of Care: Skilled Nursing Facility Prior Approval Number:    Date Approved/Denied:   PASRR Number: [id number]  Discharge Plan: SNF    Current Diagnoses: Patient Active Problem List   Diagnosis Date Noted  . Cerebral thrombosis with cerebral infarction 02/26/2020  . Generalized weakness   . COVID-19 virus infection   . Hyperglycemia due to diabetes mellitus (HCC) 02/23/2020  . Acute prerenal azotemia 02/23/2020  . Acute metabolic encephalopathy 02/23/2020  . Acute lower UTI 02/23/2020  . (HFpEF) heart failure with preserved ejection fraction (HCC) 02/23/2020  . Syncope 11/11/2014  . CKD (chronic kidney disease) 11/11/2014  . Symptomatic anemia 11/11/2014    Orientation RESPIRATION BLADDER Height & Weight     Self,Time,Situation,Place  Normal Incontinent,External catheter Weight: 81.6 kg Height:  5\' 6"  (167.6 cm)  BEHAVIORAL SYMPTOMS/MOOD NEUROLOGICAL BOWEL NUTRITION STATUS     (n/a) Continent Diet  AMBULATORY STATUS COMMUNICATION OF NEEDS Skin   Limited Assist Verbally Normal                       Personal Care Assistance Level of Assistance  Bathing,Feeding,Dressing,Total care Bathing Assistance: Limited assistance Feeding assistance: Limited assistance (set up) Dressing Assistance: Limited assistance Total Care Assistance: Limited assistance   Functional Limitations Info  Sight,Hearing,Speech Sight Info: Impaired Hearing Info:  Adequate Speech Info: Adequate    SPECIAL CARE FACTORS FREQUENCY  PT (By licensed PT),OT (By licensed OT)     PT Frequency: 5X OT Frequency: 5X            Contractures Contractures Info: Not present    Additional Factors Info  Code Status,Allergies,Psychotropic,Insulin Sliding Scale,Isolation Precautions,Suctioning Needs Code Status Info: Full Allergies Info: No known drug allergies Psychotropic Info: n/a Insulin Sliding Scale Info: see d/c summary for sliding scale info Isolation Precautions Info: contact/ airborne precautions     COVID positive Suctioning Needs: n/a   Current Medications (02/27/2020):  This is the current hospital active medication list Current Facility-Administered Medications  Medication Dose Route Frequency Provider Last Rate Last Admin  . acetaminophen (TYLENOL) tablet 650 mg  650 mg Oral Q4H PRN 02/29/2020, MD   650 mg at 02/24/20 1449  . ascorbic acid (VITAMIN C) tablet 500 mg  500 mg Oral Daily 02/26/20, MD   500 mg at 02/26/20 0810  . aspirin EC tablet 81 mg  81 mg Oral Daily Kirby-Graham, 02/28/20, NP      . chlorpheniramine-HYDROcodone (TUSSIONEX) 10-8 MG/5ML suspension 5 mL  5 mL Oral Q12H PRN Beather Arbour, MD      . clopidogrel (PLAVIX) tablet 75 mg  75 mg Oral Daily Kirby-Graham, Arnetha Courser, NP      . enoxaparin (LOVENOX) injection 40 mg  40 mg Subcutaneous Q24H Norins, Beather Arbour, MD   40 mg at 02/26/20 1400  . guaiFENesin-dextromethorphan (ROBITUSSIN DM) 100-10 MG/5ML syrup 10 mL  10 mL Oral Q4H PRN 02/28/20, MD      .  insulin aspart (novoLOG) injection 0-15 Units  0-15 Units Subcutaneous TID WC Norins, Rosalyn Gess, MD   2 Units at 02/26/20 1710  . Ipratropium-Albuterol (COMBIVENT) respimat 1 puff  1 puff Inhalation Q6H PRN Regalado, Belkys A, MD      . linagliptin (TRADJENTA) tablet 5 mg  5 mg Oral Daily Regalado, Belkys A, MD   5 mg at 02/26/20 0811  . losartan (COZAAR) tablet 25 mg  25 mg Oral Daily Marjie Skiff E, PA-C      .  metoprolol succinate (TOPROL-XL) 24 hr tablet 25 mg  25 mg Oral Daily Goodrich, Callie E, PA-C      . multivitamin with minerals tablet 1 tablet  1 tablet Oral Daily Norins, Rosalyn Gess, MD   1 tablet at 02/26/20 [phone]  . senna (SENOKOT) tablet 8.6 mg  1 tablet Oral BID Jacques Navy, MD   8.6 mg at 02/26/20 2150  . traMADol (ULTRAM) tablet 50 mg  50 mg Oral Q8H PRN Norins, Rosalyn Gess, MD   50 mg at 02/25/20 1841  . vitamin B-12 (CYANOCOBALAMIN) tablet 100 mcg  100 mcg Oral Daily Regalado, Belkys A, MD   100 mcg at 02/26/20 1144  . zinc sulfate capsule 220 mg  220 mg Oral Daily Arnetha Courser, MD   220 mg at 02/26/20 2202     Discharge Medications: Please see discharge summary for a list of discharge medications.  Relevant Imaging Results:  Relevant Lab Results:   Additional Information SS# [id number]       COVID positive patient  Beckie Busing, RN

## 2020-02-27 NOTE — Plan of Care (Signed)
  Problem: Respiratory: Goal: Will maintain a patent airway Outcome: Completed/Met   Problem: Respiratory: Goal: Complications related to the disease process, condition or treatment will be avoided or minimized Outcome: Completed/Met

## 2020-02-27 NOTE — Progress Notes (Addendum)
PROGRESS NOTE    Ruth Reynolds  QBH:[id number] DOB: 1950/10/22 DOA: 02/23/2020 PCP: Patient, No Pcp Per   Brief Narrative: 69 year old with past medical history significant for diabetes type 2, chronic anemia, history of SDH after a fall, HF PEF who presents via EMS after being found on the floor.  Patient states she was sleeping on the floor because her bed was filled with a magazine and she felt like that was the best place to fall asleep.  Admitting spoke with patient's niece who notes that patient fell yesterday and has been on the floor for roughly 24 hours.  Patient currently lives with her niece, who notes that she attempted to help patient up however patient did not want any help and continue to lay on the floor for roughly 1 day.  Niece reports generalized weakness over the past few weeks and increased fatigue.  Incontinence episode.  Over the past few months patient has been ambulated with a cane.  She will require skilled nursing placement   Assessment & Plan:   Principal Problem:   COVID-19 virus infection Active Problems:   Symptomatic anemia   Hyperglycemia due to diabetes mellitus (HCC)   Acute prerenal azotemia   Acute metabolic encephalopathy   Acute lower UTI   (HFpEF) heart failure with preserved ejection fraction (HCC)   Generalized weakness   Cerebral thrombosis with cerebral infarction   1-COVID-19 infection: Chest x-ray with some concern for infiltrate, saturation well on room air. Received 3 days of Remdesivir.  COVID-19 Labs  Recent Labs    02/25/20 0500 02/26/20 0412 02/27/20 0500  DDIMER 1.55* 2.16* 2.22*  CRP 15.3* 13.6* 13.6*    Lab Results  Component Value Date   SARSCOV2NAA POSITIVE (A) 02/23/2020   UTI: On Cipro for concern for UTI.  Urine culture negative.  Antibiotics discontinue.  Fall/generalized weakness: PT recommending skilled nursing facility  Diabetes type 2: She was a started on Tradjenta. continue with Lantus sliding scale  insulin  AKI: Resolved with hydration  Acute- sub acute stroke: Acute metabolic encephalopathy: Patient is alert, slowly answering questions -MRI positive for stroke. 7 mm focus diffusion abnormality involving right paramedian pons, consistent with an acute to sub acute infarct. Multiples remote lacunar infarct.  -LDL 83, start statins.  -Hb A1c. 9.8 -Started on aspirin and plavix. Needs plavix aspirin for 3 weeks, then aspirin alone.  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  Diastolic dysfunction On Coozar, and toprol.  Cardiology will arrange  follow up out patient.  Elevated D dimer likely related to Covid. Patient denies chest pain.   HTN; started  metoprolol./  Resume lisinopril. HCTZ due to AKI.   Estimated body mass index is 29.04 kg/m as calculated from the following:   Height as of this encounter: 5\' 6"  (1.676 m).   Weight as of this encounter: 81.6 kg.   DVT prophylaxis: Lovenox Code Status: Full code Family Communication: niece and sister over phone. 1/26 Disposition Plan:  Status is: Inpatient  Remains inpatient appropriate because:IV treatments appropriate due to intensity of illness or inability to take PO   Dispo: The patient is from: Home              Anticipated d/c is to: SNF  Anticipated d/c date is: 1 day              Patient currently is medically stable to d/c. Awaiting SNF   Difficult to place patient No        Consultants:   none  Procedures:   none  Antimicrobials:    Subjective: She is alert, conversant. Denies chest pain.   Objective: Vitals:   02/26/20 0800 02/26/20 1548 02/26/20 2141 02/27/20 0634  BP: (!) 160/76 122/73 129/65 125/68  Pulse: 74 78 88 72  Resp: 16 20 16 16   Temp:  99 F (37.2 C) 97.8 F (36.6 C) 98.7 F (37.1 C) 98.1 F (36.7 C)  TempSrc: Oral     SpO2: 99% 96% 100% 99%  Weight: 81.6 kg     Height: 5\' 6"  (1.676 m)       Intake/Output Summary (Last 24 hours) at 02/27/2020 1311 Last data filed at 02/26/2020 2146 Gross per 24 hour  Intake --  Output 300 ml  Net -300 ml   Filed Weights   02/26/20 0800  Weight: 81.6 kg    Examination:  General exam: NAD Respiratory system: CTA Cardiovascular system: S 1, S 2 RRR Gastrointestinal system: BS present, soft, nt Central nervous system: alert, following command Extremities: Moves all extremities.     Data Reviewed: I have personally reviewed following labs and imaging studies  CBC: Recent Labs  Lab 02/24/20 0135 02/24/20 0524 02/25/20 0500 02/26/20 0412 02/27/20 0500  WBC 9.3 8.5 6.2 6.9 7.1  NEUTROABS  --   --  4.1 5.4 5.0  HGB 8.2* 7.3* 7.5* 7.2* 7.3*  HCT 26.1* 23.5* 25.2* 22.5* 24.1*  MCV 72.3* 72.5* 75.0* 71.4* 73.0*  PLT 507* 461* 421* 490* 612*   Basic Metabolic Panel: Recent Labs  Lab 02/24/20 0135 02/24/20 0524 02/25/20 0500 02/26/20 0412 02/27/20 0500  NA 135 135 134* 137 136  K 4.3 4.0 4.3 4.1 3.8  CL 102 103 102 104 105  CO2 24 22 22 24 22   GLUCOSE 245* 119* 171* 91 160*  BUN 23 21 20 18 22   CREATININE 1.10* 0.99 1.06* 0.89 1.05*  CALCIUM 8.4* 8.1* 8.3* 8.5* 8.2*  MG  --   --  1.9 2.0 2.0  PHOS  --   --  4.2 4.2 4.2   GFR: Estimated Creatinine Clearance: 54.4 mL/min (A) (by C-G formula based on SCr of 1.05 mg/dL (H)). Liver Function Tests: Recent Labs  Lab 02/23/20 0954 02/25/20 0500 02/26/20 0412 02/27/20 0500  AST 18 18 14* 13*  ALT 11 11 11 10   ALKPHOS 72 65 64 62  BILITOT 0.5 0.2* 0.5 0.3  PROT 8.2* 7.0 6.7 6.5  ALBUMIN 2.9* 2.2* 2.2* 2.2*   Recent Labs  Lab 02/23/20 0954  LIPASE 27   No results for input(s): AMMONIA in the last 168 hours. Coagulation Profile: No results for input(s): INR, PROTIME in the last 168 hours. Cardiac  Enzymes: Recent Labs  Lab 02/23/20 0954  CKTOTAL 130   BNP (last 3 results) No results for input(s): PROBNP in the last 8760 hours. HbA1C: No results for input(s): HGBA1C in the last 72 hours. CBG: Recent Labs  Lab 02/26/20 1153 02/26/20 1548 02/26/20 2143 02/27/20 0726 02/27/20 1136  GLUCAP 89 124* 207* 132* 267*   Lipid Profile: Recent Labs    02/27/20 0500  CHOL 119  HDL 27*  LDLCALC 83  TRIG 46  CHOLHDL 4.4   Thyroid Function Tests: No results for input(s): TSH, T4TOTAL,  FREET4, T3FREE, THYROIDAB in the last 72 hours. Anemia Panel: No results for input(s): VITAMINB12, FOLATE, FERRITIN, TIBC, IRON, RETICCTPCT in the last 72 hours. Sepsis Labs: Recent Labs  Lab 02/24/20 1556  PROCALCITON <0.10    Recent Results (from the past 240 hour(s))  SARS CORONAVIRUS 2 (TAT 6-24 HRS) Nasopharyngeal Nasopharyngeal Swab     Status: Abnormal   Collection Time: 02/23/20 11:48 AM   Specimen: Nasopharyngeal Swab  Result Value Ref Range Status   SARS Coronavirus 2 POSITIVE (A) NEGATIVE Final    Comment: (NOTE) SARS-CoV-2 target nucleic acids are DETECTED.  The SARS-CoV-2 RNA is generally detectable in upper and lower respiratory specimens during the acute phase of infection. Positive results are indicative of the presence of SARS-CoV-2 RNA. Clinical correlation with patient history and other diagnostic information is  necessary to determine patient infection status. Positive results do not rule out bacterial infection or co-infection with other viruses.  The expected result is Negative.  Fact Sheet for Patients: HairSlick.no  Fact Sheet for Healthcare Providers: quierodirigir.com  This test is not yet approved or cleared by the [place] FDA and  has been authorized for detection and/or diagnosis of SARS-CoV-2 by FDA under an Emergency Use Authorization (EUA). This EUA will remain  in effect (meaning this test  can be used) for the duration of the COVID-19 declaration under Section 564(b)(1) of the Act, 21 U. S.C. section 360bbb-3(b)(1), unless the authorization is terminated or revoked sooner.   Performed at [facility], [address] [place], [place] [address]   Urine culture     Status: None   Collection Time: 02/23/20  3:10 PM   Specimen: Urine, Catheterized  Result Value Ref Range Status   Specimen Description URINE, CATHETERIZED  Final   Special Requests NONE  Final   Culture   Final    NO GROWTH Performed at [facility], [address], [place], [place] [address]    Report Status 02/24/2020 FINAL  Final         Radiology Studies: CT Code Stroke CTA Head W/WO contrast  Result Date: 02/26/2020 CLINICAL DATA:  Stroke. EXAM: CT ANGIOGRAPHY HEAD AND NECK TECHNIQUE: Multidetector CT imaging of the head and neck was performed using the standard protocol during bolus administration of intravenous contrast. Multiplanar CT image reconstructions and MIPs were obtained to evaluate the vascular anatomy. Carotid stenosis measurements (when applicable) are obtained utilizing NASCET criteria, using the distal internal carotid diameter as the denominator. CONTRAST:  41mL OMNIPAQUE IOHEXOL 350 MG/ML SOLN COMPARISON:  Head MRI 02/25/2020 FINDINGS: CT HEAD FINDINGS Brain: The subcentimeter acute to early subacute right pontine infarct on MRI is not visible by CT. No acute cortically based infarct, intracranial hemorrhage, mass, midline shift, or extra-axial fluid collection is identified. Confluent hypodensities in the cerebral white matter bilaterally are nonspecific but compatible with severe chronic small vessel ischemic disease. There are chronic lacunar infarcts in the thalami. Vascular: Calcified atherosclerosis at the skull base. Skull: No fracture or suspicious osseous lesion. Sinuses: Mild scattered mucosal thickening in the paranasal sinuses. Clear mastoid air cells. Orbits:  Unremarkable. Review of the MIP images confirms the above findings CTA NECK FINDINGS Aortic arch: Standard 3 vessel aortic arch with mild atherosclerotic plaque. No arch vessel origin stenosis. Right carotid system: Patent with mild calcified and soft plaque at the carotid bifurcation. No evidence of significant stenosis or dissection. Tortuous mid cervical ICA. Beading of the mid and distal cervical ICA. Left carotid system: Patent with  mild calcified and soft plaque at the carotid bifurcation. No evidence of significant stenosis or dissection. Tortuous mid cervical ICA. Beading of the mid and distal cervical ICA. 4 mm laterally projecting outpouching from the ICA just below the skull base suggestive of a small pseudoaneurysm. Vertebral arteries: Patent without evidence of significant stenosis or dissection. Strongly dominant left vertebral artery. Skeleton: Advanced disc degeneration from C4-5 to C6-7. Other neck: No evidence of cervical lymphadenopathy or mass. Upper chest: Partially visualized small area of peripheral ground-glass opacity laterally in the right upper lobe. Review of the MIP images confirms the above findings CTA HEAD FINDINGS Anterior circulation: The internal carotid arteries are patent from skull base to carotid termini with mild atherosclerotic plaque bilaterally not resulting in significant stenosis. There is a 2 mm aneurysm projecting inferiorly from the left supraclinoid ICA in the posterior communicating region. ACAs and MCAs are patent without evidence of a proximal branch occlusion. There is mild irregularity of the M1 segments and MCA branch vessels bilaterally without a flow limiting proximal stenosis, and there is moderate irregular narrowing of distal ACA branch vessels. Posterior circulation: The intracranial vertebral arteries are patent to the basilar with atherosclerotic plaque on the left resulting in luminal irregularity but no significant stenosis. The basilar artery is  widely patent. Posterior communicating arteries are diminutive or absent. The PCAs are patent with mild distal branch vessel irregularity bilaterally as well as mild multifocal stenosis of the left P2 segment. No aneurysm is identified. Venous sinuses: As permitted by contrast timing, patent. Anatomic variants: None. Review of the MIP images confirms the above findings IMPRESSION: 1. Intracranial atherosclerosis without a medium or large vessel occlusion or flow limiting proximal stenosis. 2. 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 4. Partially visualized small area of pulmonary ground-glass opacity laterally in the right upper lobe, nonspecific and incompletely evaluated though likely reflecting known COVID-19 infection. 5. Aortic Atherosclerosis (ICD10-I70.0). Electronically Signed   By: Sebastian Ache M.D.   On: 02/26/2020 21:10   CT Code Stroke CTA Neck W/WO contrast  Result Date: 02/26/2020 CLINICAL DATA:  Stroke. EXAM: CT ANGIOGRAPHY HEAD AND NECK TECHNIQUE: Multidetector CT imaging of the head and neck was performed using the standard protocol during bolus administration of intravenous contrast. Multiplanar CT image reconstructions and MIPs were obtained to evaluate the vascular anatomy. Carotid stenosis measurements (when applicable) are obtained utilizing NASCET criteria, using the distal internal carotid diameter as the denominator. CONTRAST:  2mL OMNIPAQUE IOHEXOL 350 MG/ML SOLN COMPARISON:  Head MRI 02/25/2020 FINDINGS: CT HEAD FINDINGS Brain: The subcentimeter acute to early subacute right pontine infarct on MRI is not visible by CT. No acute cortically based infarct, intracranial hemorrhage, mass, midline shift, or extra-axial fluid collection is identified. Confluent hypodensities in the cerebral white matter bilaterally are nonspecific but  compatible with severe chronic small vessel ischemic disease. There are chronic lacunar infarcts in the thalami. Vascular: Calcified atherosclerosis at the skull base. Skull: No fracture or suspicious osseous lesion. Sinuses: Mild scattered mucosal thickening in the paranasal sinuses. Clear mastoid air cells. Orbits: Unremarkable. Review of the MIP images confirms the above findings CTA NECK FINDINGS Aortic arch: Standard 3 vessel aortic arch with mild atherosclerotic plaque. No arch vessel origin stenosis. Right carotid system: Patent with mild calcified and soft plaque at the carotid bifurcation. No evidence of significant stenosis or dissection. Tortuous mid cervical ICA. Beading  of the mid and distal cervical ICA. Left carotid system: Patent with mild calcified and soft plaque at the carotid bifurcation. No evidence of significant stenosis or dissection. Tortuous mid cervical ICA. Beading of the mid and distal cervical ICA. 4 mm laterally projecting outpouching from the ICA just below the skull base suggestive of a small pseudoaneurysm. Vertebral arteries: Patent without evidence of significant stenosis or dissection. Strongly dominant left vertebral artery. Skeleton: Advanced disc degeneration from C4-5 to C6-7. Other neck: No evidence of cervical lymphadenopathy or mass. Upper chest: Partially visualized small area of peripheral ground-glass opacity laterally in the right upper lobe. Review of the MIP images confirms the above findings CTA HEAD FINDINGS Anterior circulation: The internal carotid arteries are patent from skull base to carotid termini with mild atherosclerotic plaque bilaterally not resulting in significant stenosis. There is a 2 mm aneurysm projecting inferiorly from the left supraclinoid ICA in the posterior communicating region. ACAs and MCAs are patent without evidence of a proximal branch occlusion. There is mild irregularity of the M1 segments and MCA branch vessels bilaterally without a  flow limiting proximal stenosis, and there is moderate irregular narrowing of distal ACA branch vessels. Posterior circulation: The intracranial vertebral arteries are patent to the basilar with atherosclerotic plaque on the left resulting in luminal irregularity but no significant stenosis. The basilar artery is widely patent. Posterior communicating arteries are diminutive or absent. The PCAs are patent with mild distal branch vessel irregularity bilaterally as well as mild multifocal stenosis of the left P2 segment. No aneurysm is identified. Venous sinuses: As permitted by contrast timing, patent. Anatomic variants: None. Review of the MIP images confirms the above findings IMPRESSION: 1. Intracranial atherosclerosis without a medium or large vessel occlusion or flow limiting proximal stenosis. 2. 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 4. Partially visualized small area of pulmonary ground-glass opacity laterally in the right upper lobe, nonspecific and incompletely evaluated though likely reflecting known COVID-19 infection. 5. Aortic Atherosclerosis (ICD10-I70.0). Electronically Signed   By: Sebastian AcheAllen  Grady M.D.   On: 02/26/2020 21:10   MR BRAIN WO CONTRAST  Result Date: 02/26/2020 CLINICAL DATA:  Initial evaluation for acute delirium, found down. EXAM: MRI HEAD WITHOUT CONTRAST TECHNIQUE: Multiplanar, multiecho pulse sequences of the brain and surrounding structures were obtained without intravenous contrast. COMPARISON:  Prior CT from 02/23/2020. FINDINGS: Brain: Diffuse prominence of the CSF containing spaces compatible with generalized age-related cerebral atrophy. Extensive patchy and confluent T2/FLAIR hyperintensity throughout the periventricular and deep white matter both cerebral hemispheres as well as the pons total most consistent with chronic  microvascular ischemic disease, advanced in nature. Multiple superimposed remote lacunar infarcts present about the periventricular white matter of the corona radiata as well as the deep gray nuclei. 7 mm focus of mild diffusion abnormality seen involving the right paramedian pons, consistent with an acute to subacute small vessel type infarct (series 5, image 61). No associated hemorrhage or mass effect. No other foci of diffusion abnormality to suggest acute or subacute ischemia. Gray-white matter differentiation otherwise maintained. No encephalomalacia to suggest chronic cortical infarction elsewhere within the brain. No other foci of susceptibility artifact to suggest acute or chronic intracranial hemorrhage. No mass lesion, midline shift or mass effect. Mild diffuse ventricular prominence related to global parenchymal volume loss without hydrocephalus. No made of a partially empty sella. No extra-axial fluid collection. Midline structures intact.  Vascular: Major intracranial vascular flow voids are maintained. Strongly dominant left vertebral artery noted. Skull and upper cervical spine: Craniocervical junction within normal limits. Upper cervical spine demonstrates no acute finding. Bone marrow signal intensity within normal limits. Scalp soft tissues unremarkable. Sinuses/Orbits: Globes and orbital soft tissues within normal limits. Mild-to-moderate mucosal thickening noted throughout the paranasal sinuses, likely allergic/inflammatory nature. Mastoid air cells are largely clear. Other: None. IMPRESSION: 1. 7 mm focus of diffusion abnormality involving the right paramedian pons, consistent with an acute to early subacute small vessel type infarct. No associated hemorrhage or mass effect. 2. No other acute intracranial abnormality. 3. Age-related cerebral atrophy with advanced chronic microvascular ischemic disease, with multiple superimposed remote lacunar infarcts involving the hemispheric cerebral white  matter and deep gray nuclei. Electronically Signed   By: Rise Mu M.D.   On: 02/26/2020 00:24        Scheduled Meds: . vitamin C  500 mg Oral Daily  . aspirin EC  81 mg Oral Daily  . atorvastatin  40 mg Oral QPM  . clopidogrel  75 mg Oral Daily  . enoxaparin (LOVENOX) injection  40 mg Subcutaneous Q24H  . insulin aspart  0-15 Units Subcutaneous TID WC  . linagliptin  5 mg Oral Daily  . losartan  25 mg Oral Daily  . metoprolol succinate  25 mg Oral Daily  . multivitamin with minerals  1 tablet Oral Daily  . senna  1 tablet Oral BID  . vitamin B-12  100 mcg Oral Daily  . zinc sulfate  220 mg Oral Daily   Continuous Infusions:    LOS: 3 days    Time spent: 35 minutes    Reilyn Nelson A Berlin Mokry, MD [facility]   If 7PM-7AM, please contact night-coverage [email or web address]  02/27/2020, 1:11 PM

## 2020-02-27 NOTE — TOC Progression Note (Addendum)
Transition of Care [facility]) - Progression Note    Patient Details  Name: Ruth Reynolds MRN: [id number] Date of Birth: January 11, 1951  [facility] CM/SW Contact  Beckie Busing, RN Phone Number: [phone]  02/27/2020, 1:29 PM  Clinical Narrative:    CM received call from director of [facility] stating that the facility will not be able to make a bed offer for this patient. Director will revisit with the Administrator but for now the facility is declining bed offer at this time. CM will continue bed search.   [address] has no covid beds avaliable   Expected Discharge Plan: Skilled Nursing Facility Barriers to Discharge: Continued Medical Work up  Expected Discharge Plan and Services Expected Discharge Plan: Skilled Nursing Facility In-house Referral: NA Discharge Planning Services: CM Consult   Living arrangements for the past 2 months: Single Family Home                 DME Arranged: N/A DME Agency: NA                   Social Determinants of Health (SDOH) Interventions    Readmission Risk Interventions No flowsheet data found.

## 2020-02-28 DIAGNOSIS — U071 COVID-19: Secondary | ICD-10-CM | POA: Diagnosis not present

## 2020-02-28 LAB — CBC WITH DIFFERENTIAL/PLATELET
Abs Immature Granulocytes: 0.04 10*3/uL (ref 0.00–0.07)
Basophils Absolute: 0 10*3/uL (ref 0.0–0.1)
Basophils Relative: 0 %
Eosinophils Absolute: 0.1 10*3/uL (ref 0.0–0.5)
Eosinophils Relative: 1 %
HCT: 21.2 % — ABNORMAL LOW (ref 36.0–46.0)
Hemoglobin: 7 g/dL — ABNORMAL LOW (ref 12.0–15.0)
Immature Granulocytes: 1 %
Lymphocytes Relative: 19 %
Lymphs Abs: 1.1 10*3/uL (ref 0.7–4.0)
MCH: 23.4 pg — ABNORMAL LOW (ref 26.0–34.0)
MCHC: 33 g/dL (ref 30.0–36.0)
MCV: 70.9 fL — ABNORMAL LOW (ref 80.0–100.0)
Monocytes Absolute: 0.8 10*3/uL (ref 0.1–1.0)
Monocytes Relative: 13 %
Neutro Abs: 3.8 10*3/uL (ref 1.7–7.7)
Neutrophils Relative %: 66 %
Platelets: 546 10*3/uL — ABNORMAL HIGH (ref 150–400)
RBC: 2.99 MIL/uL — ABNORMAL LOW (ref 3.87–5.11)
RDW: 20 % — ABNORMAL HIGH (ref 11.5–15.5)
WBC: 5.8 10*3/uL (ref 4.0–10.5)
nRBC: 0.3 % — ABNORMAL HIGH (ref 0.0–0.2)

## 2020-02-28 LAB — TYPE AND SCREEN
ABO/RH(D): O POS
Antibody Screen: POSITIVE
DAT, IgG: NEGATIVE
Unit division: 0
Unit division: 0

## 2020-02-28 LAB — COMPREHENSIVE METABOLIC PANEL
ALT: 10 U/L (ref 0–44)
AST: 13 U/L — ABNORMAL LOW (ref 15–41)
Albumin: 2.2 g/dL — ABNORMAL LOW (ref 3.5–5.0)
Alkaline Phosphatase: 65 U/L (ref 38–126)
Anion gap: 10 (ref 5–15)
BUN: 18 mg/dL (ref 8–23)
CO2: 22 mmol/L (ref 22–32)
Calcium: 8.3 mg/dL — ABNORMAL LOW (ref 8.9–10.3)
Chloride: 104 mmol/L (ref 98–111)
Creatinine, Ser: 0.89 mg/dL (ref 0.44–1.00)
GFR, Estimated: >60 mL/min (ref >60)
Glucose, Bld: 149 mg/dL — ABNORMAL HIGH (ref 70–99)
Potassium: 3.9 mmol/L (ref 3.5–5.1)
Sodium: 136 mmol/L (ref 135–145)
Total Bilirubin: 0.4 mg/dL (ref 0.3–1.2)
Total Protein: 6.4 g/dL — ABNORMAL LOW (ref 6.5–8.1)

## 2020-02-28 LAB — MAGNESIUM: Magnesium: 2 mg/dL (ref 1.7–2.4)

## 2020-02-28 LAB — PATHOLOGIST SMEAR REVIEW

## 2020-02-28 LAB — D-DIMER, QUANTITATIVE: D-Dimer, Quant: 2.29 ug/mL-FEU — ABNORMAL HIGH (ref 0.00–0.50)

## 2020-02-28 LAB — C-REACTIVE PROTEIN: CRP: 11 mg/dL — ABNORMAL HIGH (ref <1.0)

## 2020-02-28 LAB — BPAM RBC
Blood Product Expiration Date: 202202252359
Blood Product Expiration Date: 202202262359
Unit Type and Rh: 5100
Unit Type and Rh: 5100

## 2020-02-28 LAB — GLUCOSE, CAPILLARY
Glucose-Capillary: 126 mg/dL — ABNORMAL HIGH (ref 70–99)
Glucose-Capillary: 165 mg/dL — ABNORMAL HIGH (ref 70–99)
Glucose-Capillary: 180 mg/dL — ABNORMAL HIGH (ref 70–99)
Glucose-Capillary: 192 mg/dL — ABNORMAL HIGH (ref 70–99)

## 2020-02-28 LAB — PHOSPHORUS: Phosphorus: 4.2 mg/dL (ref 2.5–4.6)

## 2020-02-28 MED ORDER — ENOXAPARIN SODIUM 40 MG/0.4ML ~~LOC~~ SOLN: 40.0000 mg | SUBCUTANEOUS | Status: DC

## 2020-02-28 NOTE — Progress Notes (Signed)
Occupational Therapy Treatment Patient Details Name: Ruth Reynolds MRN: [id number] DOB: 04/21/1950 Today's Date: 02/28/2020    History of present illness Pt is a 70 y/o female admitted after being on floor for a day, pt states she fell then slept there and refused to get up. Found to be COVID + with encephalopathy. PMH includes DM, anemia, CHF, and SDH.   OT comments  Pt continues to present with decreased cognition, balance, and activity tolerance. Pt requiring Min Guard-Min A for functional mobility. Pt performing oral care at sink  With supervision seated and then standing to rinse. Pt requiring Max encouragement to participate in OOB activity. Pt reporting "I am going to sit down" and "I am tired". Continue to recommend dc to SNF and will continue to follow acutely as admitted.    Follow Up Recommendations  SNF    Equipment Recommendations  3 in 1 bedside commode    Recommendations for Other Services PT consult    Precautions / Restrictions Precautions Precautions: Fall       Mobility Bed Mobility Overal bed mobility: Needs Assistance Bed Mobility: Supine to Sit;Sit to Supine     Supine to sit: Mod assist Sit to supine: Supervision   General bed mobility comments: Mod A to bring BELs towards EOB and then Supervision to return to supine  Transfers Overall transfer level: Needs assistance Equipment used: Rolling walker (2 wheeled) Transfers: Sit to/from Stand Sit to Stand: Min assist         General transfer comment: Min A to stabilize the RW to stand    Balance Overall balance assessment: Needs assistance Sitting-balance support: No upper extremity supported;Feet supported Sitting balance-Leahy Scale: Fair     Standing balance support: Bilateral upper extremity supported;During functional activity Standing balance-Leahy Scale: Poor                             ADL either performed or assessed with clinical judgement   ADL Overall ADL's : Needs  assistance/impaired     Grooming: Oral care;Supervision/safety;Set up;Sitting Grooming Details (indicate cue type and reason): Requiring Max encouragement to partiipate in task. Pt reporting "I want to sit down. I am laxy. I am tired." standing to rinse and spit                 Toilet Transfer: Minimal assistance;Ambulation;BSC;RW Toilet Transfer Details (indicate cue type and reason): Min A to stabilize RW         Functional mobility during ADLs: Min guard;Rolling walker General ADL Comments: Pt presenting with poor motivation and activity tolerance. Pt fatigues quickly. Requiring increased encouragement throughout.     Vision       Perception     Praxis      Cognition Arousal/Alertness: Lethargic Behavior During Therapy: Flat affect Overall Cognitive Status: Impaired/Different from baseline Area of Impairment: Attention;Following commands;Awareness;Problem solving;Orientation                   Current Attention Level: Sustained   Following Commands: Follows one step commands with increased time;Follows one step commands inconsistently   Awareness: Intellectual Problem Solving: Slow processing General Comments: Pt repeating "I am lazy", "I am tired", "I want to sit down". sustaining attention to ocmplete oral care task with rest breaks due to fatigue        Exercises     Shoulder Instructions       General Comments VSS on RA    Pertinent  Vitals/ Pain       Pain Assessment: No/denies pain  Home Living                                          Prior Functioning/Environment              Frequency  Min 2X/week        Progress Toward Goals  OT Goals(current goals can now be found in the care plan section)  Progress towards OT goals: Progressing toward goals  Acute Rehab OT Goals Patient Stated Goal: none stated OT Goal Formulation: With patient Time For Goal Achievement: 03/09/20 Potential to Achieve Goals:  Good ADL Goals Pt Will Perform Grooming: with min guard assist;standing Pt Will Perform Lower Body Dressing: with min assist;with min guard assist;sit to/from stand Pt Will Transfer to Toilet: with min guard assist;ambulating;bedside commode Pt Will Perform Toileting - Clothing Manipulation and hygiene: with min guard assist;sitting/lateral leans;sit to/from stand Additional ADL Goal #1: Pt will demonstrate sustained attention during ADL with no cues Additional ADL Goal #2: Pt will follow two step commands during ADLs with Min cues  Plan Discharge plan remains appropriate    Co-evaluation                 AM-PAC OT "6 Clicks" Daily Activity     Outcome Measure   Help from another person eating meals?: None Help from another person taking care of personal grooming?: A Little Help from another person toileting, which includes using toliet, bedpan, or urinal?: A Lot Help from another person bathing (including washing, rinsing, drying)?: A Lot Help from another person to put on and taking off regular upper body clothing?: A Little Help from another person to put on and taking off regular lower body clothing?: A Lot 6 Click Score: 16    End of Session Equipment Utilized During Treatment: Rolling walker  OT Visit Diagnosis: Unsteadiness on feet (R26.81);Other abnormalities of gait and mobility (R26.89);Muscle weakness (generalized) (M62.81)   Activity Tolerance Patient limited by fatigue   Patient Left in bed;with call bell/phone within reach   Nurse Communication Mobility status        Time: 2683-4196 OT Time Calculation (min): 17 min  Charges: OT General Charges $OT Visit: 1 Visit OT Treatments $Self Care/Home Management : 8-22 mins  Tatanisha Cuthbert MSOT, OTR/L Acute Rehab Pager: [phone] Office: [phone]   Theodoro Grist Jerrian Mells 02/28/2020, 5:47 PM

## 2020-02-28 NOTE — TOC Progression Note (Signed)
Transition of Care [facility]) - Progression Note    Patient Details  Name: Ruth Reynolds MRN: [id number] Date of Birth: 04/28/1950  Transition of Care [facility]) CM/SW Contact  Beckie Busing, RN Phone Number: [phone]  02/28/2020, 10:15 AM  Clinical Narrative:    Bed offer received via HUB for [facility]. CM called and spoke with Lasha who will check with Administrator to see if covid bed is still available. Will await return call.    Expected Discharge Plan: Skilled Nursing Facility Barriers to Discharge: Continued Medical Work up  Expected Discharge Plan and Services Expected Discharge Plan: Skilled Nursing Facility In-house Referral: NA Discharge Planning Services: CM Consult   Living arrangements for the past 2 months: Single Family Home                 DME Arranged: N/A DME Agency: NA                   Social Determinants of Health (SDOH) Interventions    Readmission Risk Interventions No flowsheet data found.

## 2020-02-28 NOTE — Progress Notes (Signed)
PROGRESS NOTE    Ruth Reynolds  IYM:[id number] DOB: February 19, 1950 DOA: 02/23/2020 PCP: Patient, No Pcp Per   Brief Narrative: 70 year old with past medical history significant for diabetes type 2, chronic anemia, history of SDH after a fall, HF PEF who presents via EMS after being found on the floor.  Patient states she was sleeping on the floor because her bed was filled with a magazine and she felt like that was the best place to fall asleep.  Admitting spoke with patient's niece who notes that patient fell yesterday and has been on the floor for roughly 24 hours.  Patient currently lives with her niece, who notes that she attempted to help patient up however patient did not want any help and continue to lay on the floor for roughly 1 day.  Niece reports generalized weakness over the past few weeks and increased fatigue.  Incontinence episode.  Over the past few months patient has been ambulated with a cane.  She will require skilled nursing placement   Assessment & Plan:   Principal Problem:   COVID-19 virus infection Active Problems:   Symptomatic anemia   Hyperglycemia due to diabetes mellitus (HCC)   Acute prerenal azotemia   Acute metabolic encephalopathy   Acute lower UTI   (HFpEF) heart failure with preserved ejection fraction (HCC)   Generalized weakness   Cerebral thrombosis with cerebral infarction   1-COVID-19 infection: Chest x-ray with some concern for infiltrate, saturation well on room air. Received 3 days of Remdesivir.  Stable.   COVID-19 Labs  Recent Labs    02/26/20 0412 02/27/20 0500 02/28/20 0446  DDIMER 2.16* 2.22* 2.29*  CRP 13.6* 13.6* 11.0*    Lab Results  Component Value Date   SARSCOV2NAA POSITIVE (A) 02/23/2020   UTI: On Cipro for concern for UTI.  Urine culture negative.  Antibiotics discontinue.  Fall/generalized weakness: PT recommending skilled nursing facility  Diabetes type 2: She was a started on Tradjenta. continue with Lantus  sliding scale insulin  AKI: Resolved with hydration  Acute- sub acute stroke: Acute metabolic encephalopathy: Patient is alert, slowly answering questions -MRI positive for stroke. 7 mm focus diffusion abnormality involving right paramedian pons, consistent with an acute to sub acute infarct. Multiples remote lacunar infarct.  -LDL 83, start statins.  -Hb A1c. 9.8 -Started on aspirin and plavix. Needs plavix aspirin for 3 weeks, then aspirin alone.  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  Diastolic dysfunction On Coozar, and toprol.  Cardiology will arrange  follow up out patient.  Elevated D dimer likely related to Covid. Patient denies chest pain.   HTN; started  metoprolol./  Resume lisinopril. HCTZ due to AKI.   Stable awaiting placement.   Estimated body mass index is 29.04 kg/m as calculated from the following:   Height as of this encounter: 5\' 6"  (1.676 m).   Weight as of this encounter: 81.6 kg.  DVT prophylaxis: Lovenox Code Status: Full code Family Communication: niece and sister over phone. 1/26 Disposition Plan:  Status is: Inpatient  Remains inpatient appropriate because:IV treatments appropriate due to intensity of illness or inability to take PO   Dispo: The patient is from: Home              Anticipated d/c is to: SNF  Anticipated d/c date is: 1 day              Patient currently is medically stable to d/c. Awaiting SNF   Difficult to place patient No        Consultants:   none  Procedures:   none  Antimicrobials:    Subjective: No new complaints  Objective: Vitals:   02/27/20 1407 02/27/20 2057 02/28/20 0600 02/28/20 1419  BP: 134/77 (!) 143/66 (!) 147/58 (!) 149/71  Pulse: 72 78 62 74   Resp: 16 16 14 16   Temp:  99.4 F (37.4 C) 98.4 F (36.9 C)   TempSrc:      SpO2: 100% 100% 100% 100%  Weight:      Height:        Intake/Output Summary (Last 24 hours) at 02/28/2020 1739 Last data filed at 02/28/2020 1500 Gross per 24 hour  Intake -  Output 401 ml  Net -401 ml   Filed Weights   02/26/20 0800  Weight: 81.6 kg    Examination:  General exam: NAD Respiratory system: CTA Cardiovascular system: S 1, S 2 RR Gastrointestinal system: BS present,soft Central nervous system: alert  Data Reviewed: I have personally reviewed following labs and imaging studies  CBC: Recent Labs  Lab 02/24/20 0524 02/25/20 0500 02/26/20 0412 02/27/20 0500 02/28/20 0446  WBC 8.5 6.2 6.9 7.1 5.8  NEUTROABS  --  4.1 5.4 5.0 3.8  HGB 7.3* 7.5* 7.2* 7.3* 7.0*  HCT 23.5* 25.2* 22.5* 24.1* 21.2*  MCV 72.5* 75.0* 71.4* 73.0* 70.9*  PLT 461* 421* 490* 612* 546*   Basic Metabolic Panel: Recent Labs  Lab 02/24/20 0524 02/25/20 0500 02/26/20 0412 02/27/20 0500 02/28/20 0446  NA 135 134* 137 136 136  K 4.0 4.3 4.1 3.8 3.9  CL 103 102 104 105 104  CO2 22 22 24 22 22   GLUCOSE 119* 171* 91 160* 149*  BUN 21 20 18 22 18   CREATININE 0.99 1.06* 0.89 1.05* 0.89  CALCIUM 8.1* 8.3* 8.5* 8.2* 8.3*  MG  --  1.9 2.0 2.0 2.0  PHOS  --  4.2 4.2 4.2 4.2   GFR: Estimated Creatinine Clearance: 64.2 mL/min (by C-G formula based on SCr of 0.89 mg/dL). Liver Function Tests: Recent Labs  Lab 02/23/20 0954 02/25/20 0500 02/26/20 0412 02/27/20 0500 02/28/20 0446  AST 18 18 14* 13* 13*  ALT 11 11 11 10 10   ALKPHOS 72 65 64 62 65  BILITOT 0.5 0.2* 0.5 0.3 0.4  PROT 8.2* 7.0 6.7 6.5 6.4*  ALBUMIN 2.9* 2.2* 2.2* 2.2* 2.2*   Recent Labs  Lab 02/23/20 0954  LIPASE 27   No results for input(s): AMMONIA in the last 168 hours. Coagulation Profile: No results for input(s): INR, PROTIME in the last 168 hours. Cardiac Enzymes: Recent Labs  Lab 02/23/20 0954  CKTOTAL 130   BNP (last  3 results) No results for input(s): PROBNP in the last 8760 hours. HbA1C: No results for input(s): HGBA1C in the last 72 hours. CBG: Recent Labs  Lab 02/27/20 1622 02/27/20 2058 02/28/20 0744 02/28/20 1149 02/28/20 1629  GLUCAP 108* 160* 126* 165* 180*   Lipid Profile: Recent Labs    02/27/20 0500  CHOL 119  HDL 27*  LDLCALC 83  TRIG 46  CHOLHDL 4.4   Thyroid Function Tests: No results for input(s): TSH, T4TOTAL, FREET4, T3FREE, THYROIDAB in the last 72 hours. Anemia Panel: No results for input(s): VITAMINB12, FOLATE, FERRITIN, TIBC, IRON, RETICCTPCT in the last 72  hours. Sepsis Labs: Recent Labs  Lab 02/24/20 1556  PROCALCITON <0.10    Recent Results (from the past 240 hour(s))  SARS CORONAVIRUS 2 (TAT 6-24 HRS) Nasopharyngeal Nasopharyngeal Swab     Status: Abnormal   Collection Time: 02/23/20 11:48 AM   Specimen: Nasopharyngeal Swab  Result Value Ref Range Status   SARS Coronavirus 2 POSITIVE (A) NEGATIVE Final    Comment: (NOTE) SARS-CoV-2 target nucleic acids are DETECTED.  The SARS-CoV-2 RNA is generally detectable in upper and lower respiratory specimens during the acute phase of infection. Positive results are indicative of the presence of SARS-CoV-2 RNA. Clinical correlation with patient history and other diagnostic information is  necessary to determine patient infection status. Positive results do not rule out bacterial infection or co-infection with other viruses.  The expected result is Negative.  Fact Sheet for Patients: HairSlick.no  Fact Sheet for Healthcare Providers: quierodirigir.com  This test is not yet approved or cleared by the [place] FDA and  has been authorized for detection and/or diagnosis of SARS-CoV-2 by FDA under an Emergency Use Authorization (EUA). This EUA will remain  in effect (meaning this test can be used) for the duration of the COVID-19 declaration under  Section 564(b)(1) of the Act, 21 U. S.C. section 360bbb-3(b)(1), unless the authorization is terminated or revoked sooner.   Performed at [facility], [address], [place], [place] [address]   Urine culture     Status: None   Collection Time: 02/23/20  3:10 PM   Specimen: Urine, Catheterized  Result Value Ref Range Status   Specimen Description URINE, CATHETERIZED  Final   Special Requests NONE  Final   Culture   Final    NO GROWTH Performed at [facility], [address], [place], [place] [address]    Report Status 02/24/2020 FINAL  Final         Radiology Studies: CT Code Stroke CTA Head W/WO contrast  Result Date: 02/26/2020 CLINICAL DATA:  Stroke. EXAM: CT ANGIOGRAPHY HEAD AND NECK TECHNIQUE: Multidetector CT imaging of the head and neck was performed using the standard protocol during bolus administration of intravenous contrast. Multiplanar CT image reconstructions and MIPs were obtained to evaluate the vascular anatomy. Carotid stenosis measurements (when applicable) are obtained utilizing NASCET criteria, using the distal internal carotid diameter as the denominator. CONTRAST:  60mL OMNIPAQUE IOHEXOL 350 MG/ML SOLN COMPARISON:  Head MRI 02/25/2020 FINDINGS: CT HEAD FINDINGS Brain: The subcentimeter acute to early subacute right pontine infarct on MRI is not visible by CT. No acute cortically based infarct, intracranial hemorrhage, mass, midline shift, or extra-axial fluid collection is identified. Confluent hypodensities in the cerebral white matter bilaterally are nonspecific but compatible with severe chronic small vessel ischemic disease. There are chronic lacunar infarcts in the thalami. Vascular: Calcified atherosclerosis at the skull base. Skull: No fracture or suspicious osseous lesion. Sinuses: Mild scattered mucosal thickening in the paranasal sinuses. Clear mastoid air cells. Orbits: Unremarkable. Review of the MIP images confirms the above findings CTA  NECK FINDINGS Aortic arch: Standard 3 vessel aortic arch with mild atherosclerotic plaque. No arch vessel origin stenosis. Right carotid system: Patent with mild calcified and soft plaque at the carotid bifurcation. No evidence of significant stenosis or dissection. Tortuous mid cervical ICA. Beading of the mid and distal cervical ICA. Left carotid system: Patent with mild calcified and soft plaque at the carotid bifurcation. No evidence of significant stenosis or dissection. Tortuous mid cervical ICA. Beading of the mid  and distal cervical ICA. 4 mm laterally projecting outpouching from the ICA just below the skull base suggestive of a small pseudoaneurysm. Vertebral arteries: Patent without evidence of significant stenosis or dissection. Strongly dominant left vertebral artery. Skeleton: Advanced disc degeneration from C4-5 to C6-7. Other neck: No evidence of cervical lymphadenopathy or mass. Upper chest: Partially visualized small area of peripheral ground-glass opacity laterally in the right upper lobe. Review of the MIP images confirms the above findings CTA HEAD FINDINGS Anterior circulation: The internal carotid arteries are patent from skull base to carotid termini with mild atherosclerotic plaque bilaterally not resulting in significant stenosis. There is a 2 mm aneurysm projecting inferiorly from the left supraclinoid ICA in the posterior communicating region. ACAs and MCAs are patent without evidence of a proximal branch occlusion. There is mild irregularity of the M1 segments and MCA branch vessels bilaterally without a flow limiting proximal stenosis, and there is moderate irregular narrowing of distal ACA branch vessels. Posterior circulation: The intracranial vertebral arteries are patent to the basilar with atherosclerotic plaque on the left resulting in luminal irregularity but no significant stenosis. The basilar artery is widely patent. Posterior communicating arteries are diminutive or absent.  The PCAs are patent with mild distal branch vessel irregularity bilaterally as well as mild multifocal stenosis of the left P2 segment. No aneurysm is identified. Venous sinuses: As permitted by contrast timing, patent. Anatomic variants: None. Review of the MIP images confirms the above findings IMPRESSION: 1. Intracranial atherosclerosis without a medium or large vessel occlusion or flow limiting proximal stenosis. 2. 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 4. Partially visualized small area of pulmonary ground-glass opacity laterally in the right upper lobe, nonspecific and incompletely evaluated though likely reflecting known COVID-19 infection. 5. Aortic Atherosclerosis (ICD10-I70.0). Electronically Signed   By: Sebastian AcheAllen  Grady M.D.   On: 02/26/2020 21:10   CT Code Stroke CTA Neck W/WO contrast  Result Date: 02/26/2020 CLINICAL DATA:  Stroke. EXAM: CT ANGIOGRAPHY HEAD AND NECK TECHNIQUE: Multidetector CT imaging of the head and neck was performed using the standard protocol during bolus administration of intravenous contrast. Multiplanar CT image reconstructions and MIPs were obtained to evaluate the vascular anatomy. Carotid stenosis measurements (when applicable) are obtained utilizing NASCET criteria, using the distal internal carotid diameter as the denominator. CONTRAST:  75mL OMNIPAQUE IOHEXOL 350 MG/ML SOLN COMPARISON:  Head MRI 02/25/2020 FINDINGS: CT HEAD FINDINGS Brain: The subcentimeter acute to early subacute right pontine infarct on MRI is not visible by CT. No acute cortically based infarct, intracranial hemorrhage, mass, midline shift, or extra-axial fluid collection is identified. Confluent hypodensities in the cerebral white matter bilaterally are nonspecific but compatible with severe chronic small vessel ischemic disease. There are chronic  lacunar infarcts in the thalami. Vascular: Calcified atherosclerosis at the skull base. Skull: No fracture or suspicious osseous lesion. Sinuses: Mild scattered mucosal thickening in the paranasal sinuses. Clear mastoid air cells. Orbits: Unremarkable. Review of the MIP images confirms the above findings CTA NECK FINDINGS Aortic arch: Standard 3 vessel aortic arch with mild atherosclerotic plaque. No arch vessel origin stenosis. Right carotid system: Patent with mild calcified and soft plaque at the carotid bifurcation. No evidence of significant stenosis or dissection. Tortuous mid cervical ICA. Beading of the mid and distal cervical ICA. Left carotid system: Patent with mild calcified and soft plaque at the carotid bifurcation. No evidence of  significant stenosis or dissection. Tortuous mid cervical ICA. Beading of the mid and distal cervical ICA. 4 mm laterally projecting outpouching from the ICA just below the skull base suggestive of a small pseudoaneurysm. Vertebral arteries: Patent without evidence of significant stenosis or dissection. Strongly dominant left vertebral artery. Skeleton: Advanced disc degeneration from C4-5 to C6-7. Other neck: No evidence of cervical lymphadenopathy or mass. Upper chest: Partially visualized small area of peripheral ground-glass opacity laterally in the right upper lobe. Review of the MIP images confirms the above findings CTA HEAD FINDINGS Anterior circulation: The internal carotid arteries are patent from skull base to carotid termini with mild atherosclerotic plaque bilaterally not resulting in significant stenosis. There is a 2 mm aneurysm projecting inferiorly from the left supraclinoid ICA in the posterior communicating region. ACAs and MCAs are patent without evidence of a proximal branch occlusion. There is mild irregularity of the M1 segments and MCA branch vessels bilaterally without a flow limiting proximal stenosis, and there is moderate irregular narrowing of  distal ACA branch vessels. Posterior circulation: The intracranial vertebral arteries are patent to the basilar with atherosclerotic plaque on the left resulting in luminal irregularity but no significant stenosis. The basilar artery is widely patent. Posterior communicating arteries are diminutive or absent. The PCAs are patent with mild distal branch vessel irregularity bilaterally as well as mild multifocal stenosis of the left P2 segment. No aneurysm is identified. Venous sinuses: As permitted by contrast timing, patent. Anatomic variants: None. Review of the MIP images confirms the above findings IMPRESSION: 1. Intracranial atherosclerosis without a medium or large vessel occlusion or flow limiting proximal stenosis. 2. 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 4. Partially visualized small area of pulmonary ground-glass opacity laterally in the right upper lobe, nonspecific and incompletely evaluated though likely reflecting known COVID-19 infection. 5. Aortic Atherosclerosis (ICD10-I70.0). Electronically Signed   By: Sebastian Ache M.D.   On: 02/26/2020 21:10        Scheduled Meds: . vitamin C  500 mg Oral Daily  . aspirin EC  81 mg Oral Daily  . atorvastatin  40 mg Oral QPM  . clopidogrel  75 mg Oral Daily  . enoxaparin (LOVENOX) injection  40 mg Subcutaneous Q24H  . insulin aspart  0-15 Units Subcutaneous TID WC  . linagliptin  5 mg Oral Daily  . losartan  25 mg Oral Daily  . metoprolol succinate  25 mg Oral Daily  . multivitamin with minerals  1 tablet Oral Daily  . senna  1 tablet Oral BID  . vitamin B-12  100 mcg Oral Daily  . zinc sulfate  220 mg Oral Daily   Continuous Infusions:    LOS: 4 days    Time spent: 35 minutes    Bolden Hagerman A Magdiel Bartles, MD [facility]   If 7PM-7AM, please contact  night-coverage [email or web address]  02/28/2020, 5:39 PM

## 2020-02-28 NOTE — Care Management Important Message (Signed)
Important Message  Patient Details  Name: Ruth Reynolds MRN: [id number] Date of Birth: Nov 14, 1950   Medicare Important Message Given:  Yes - Important Message mailed due to current National Emergency   Verbal consent obtained due to current National Emergency  Relationship to patient: Self Contact Name: Cece Caradine Call Date: 02/28/20  Time: 1301 Phone: [phone] Outcome: No Answer/Busy Important Message mailed to: Patient address on file    Orson Aloe 02/28/2020, 1:01 PM

## 2020-02-29 DIAGNOSIS — U071 COVID-19: Secondary | ICD-10-CM

## 2020-02-29 LAB — CBC WITH DIFFERENTIAL/PLATELET
Abs Immature Granulocytes: 0.09 10*3/uL — ABNORMAL HIGH (ref 0.00–0.07)
Basophils Absolute: 0 10*3/uL (ref 0.0–0.1)
Basophils Relative: 0 %
Eosinophils Absolute: 0.1 10*3/uL (ref 0.0–0.5)
Eosinophils Relative: 2 %
HCT: 20.6 % — ABNORMAL LOW (ref 36.0–46.0)
Hemoglobin: 6.6 g/dL — CL (ref 12.0–15.0)
Immature Granulocytes: 1 %
Lymphocytes Relative: 15 %
Lymphs Abs: 1 10*3/uL (ref 0.7–4.0)
MCH: 22.4 pg — ABNORMAL LOW (ref 26.0–34.0)
MCHC: 32 g/dL (ref 30.0–36.0)
MCV: 70.1 fL — ABNORMAL LOW (ref 80.0–100.0)
Monocytes Absolute: 0.8 10*3/uL (ref 0.1–1.0)
Monocytes Relative: 12 %
Neutro Abs: 4.5 10*3/uL (ref 1.7–7.7)
Neutrophils Relative %: 70 %
Platelets: 627 10*3/uL — ABNORMAL HIGH (ref 150–400)
RBC: 2.94 MIL/uL — ABNORMAL LOW (ref 3.87–5.11)
RDW: 19.9 % — ABNORMAL HIGH (ref 11.5–15.5)
WBC: 6.4 10*3/uL (ref 4.0–10.5)
nRBC: 0 % (ref 0.0–0.2)

## 2020-02-29 LAB — PREPARE RBC (CROSSMATCH)

## 2020-02-29 LAB — GLUCOSE, CAPILLARY
Glucose-Capillary: 139 mg/dL — ABNORMAL HIGH (ref 70–99)
Glucose-Capillary: 224 mg/dL — ABNORMAL HIGH (ref 70–99)
Glucose-Capillary: 153 mg/dL — ABNORMAL HIGH (ref 70–99)
Glucose-Capillary: 176 mg/dL — ABNORMAL HIGH (ref 70–99)

## 2020-02-29 MED ORDER — FOLIC ACID 1 MG PO TABS
1.0000 mg | ORAL_TABLET | Freq: Every day | ORAL | Status: DC
Start: 2020-02-29 — End: 2020-03-11
  Administered 2020-02-29 – 2020-03-10 (×11): 1 mg via ORAL
  Filled 2020-02-29 (×11): qty 1

## 2020-02-29 MED ORDER — SODIUM CHLORIDE 0.9 % IV SOLN
510.0000 mg | Freq: Once | INTRAVENOUS | Status: AC
  Administered 2020-02-29: 510 mg via INTRAVENOUS
  Filled 2020-02-29: qty 17

## 2020-02-29 MED ORDER — SODIUM CHLORIDE 0.9% IV SOLUTION: Freq: Once | INTRAVENOUS | Status: AC

## 2020-02-29 NOTE — Progress Notes (Signed)
PROGRESS NOTE    Ruth Reynolds  NFA:[id number] DOB: 26-Apr-1950 DOA: 02/23/2020 PCP: Ruth Reynolds, No Pcp Per   Brief Narrative: 70 year old with past medical history significant for diabetes type 2, chronic anemia, history of SDH after a fall, HF PEF who presents via EMS after being found on the floor.  Ruth Reynolds states she was sleeping on the floor because her bed was filled with a magazine and she felt like that was the best place to fall asleep.  Admitting spoke with Ruth Reynolds's niece who notes that Ruth Reynolds fell yesterday and has been on the floor for roughly 24 hours.  Ruth Reynolds currently lives with her niece, who notes that she attempted to help Ruth Reynolds up however Ruth Reynolds did not want any help and continue to lay on the floor for roughly 1 day.  Niece reports generalized weakness over the past few weeks and increased fatigue.  Incontinence episode.  Over the past few months Ruth Reynolds has been ambulated with a cane.  She will require skilled nursing placement   Assessment & Plan:   Principal Problem:   COVID-19 virus infection Active Problems:   Symptomatic anemia   Hyperglycemia due to diabetes mellitus (HCC)   Acute prerenal azotemia   Acute metabolic encephalopathy   Acute lower UTI   (HFpEF) heart failure with preserved ejection fraction (HCC)   Generalized weakness   Cerebral thrombosis with cerebral infarction   1-COVID-19 infection: Chest x-ray with some concern for infiltrate, saturation well on room air. Received 3 days of Remdesivir.  Stable.   COVID-19 Labs  Recent Labs    02/27/20 0500 02/28/20 0446  DDIMER 2.22* 2.29*  CRP 13.6* 11.0*    Lab Results  Component Value Date   SARSCOV2NAA POSITIVE (A) 02/23/2020   UTI: On Cipro for concern for UTI.  Urine culture negative.  Antibiotics discontinue.  Fall/generalized weakness: PT recommending skilled nursing facility  Diabetes type 2: She was a started on Tradjenta. continue with Lantus sliding scale insulin  AKI:  Resolved with hydration  Acute- sub acute stroke: Acute metabolic encephalopathy: Ruth Reynolds is alert, slowly answering questions -MRI positive for stroke. 7 mm focus diffusion abnormality involving right paramedian pons, consistent with an acute to sub acute infarct. Multiples remote lacunar infarct.  -LDL 83, start statins.  -Hb A1c. 9.8 -Started on aspirin and plavix. Needs plavix aspirin for 3 weeks, then aspirin alone.  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   Iron  deficiency anemia: Started on iron supplement Hb decreased to 6.6. Ruth Reynolds refuse blood transfusion.   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  Diastolic dysfunction On Coozar, and toprol.  Cardiology will arrange  follow up out Ruth Reynolds.  Elevated D dimer likely related to Covid. Ruth Reynolds denies chest pain.   HTN; started  metoprolol./  Resume lisinopril. HCTZ due to AKI.   Stable awaiting placement.   Estimated body mass index is 29.04 kg/m as calculated from the following:   Height as of this encounter: 5\' 6"  (1.676 m).   Weight as of this encounter: 81.6 kg.  DVT prophylaxis: Lovenox Code Status: Full code Family Communication: niece and sister over phone. 1/26 family 1/29 Disposition Plan:  Status is: Inpatient  Remains inpatient appropriate because:IV treatments appropriate due to intensity of illness or inability to take PO   Dispo: The Ruth Reynolds is from: Home  Anticipated d/c is to: SNF              Anticipated d/c date is: 1 day              Ruth Reynolds currently is medically stable to d/c. Awaiting SNF   Difficult to place Ruth Reynolds No        Consultants:   none  Procedures:   none  Antimicrobials:    Subjective: She refuse Blood transfusion,, she doesn't want blood, she doesn't see it will make any difference. I explain to her risk for death.,  MI if she doesn't get blood transfusion.    Objective: Vitals:   02/28/20 1419 02/28/20 2215 02/29/20 0559 02/29/20 0803  BP: (!) 149/71 (!) 145/72 (!) 141/76 (!) 160/72  Pulse: 74 90 75 67  Resp: 16 16 16 18   Temp:  98.3 F (36.8 C) 98.8 F (37.1 C) 98.4 F (36.9 C)  TempSrc:    Oral  SpO2: 100% 100% 100% 100%  Weight:      Height:        Intake/Output Summary (Last 24 hours) at 02/29/2020 03/02/2020 Last data filed at 02/28/2020 1500 Gross per 24 hour  Intake -  Output 1 ml  Net -1 ml   Filed Weights   02/26/20 0800  Weight: 81.6 kg    Examination:  General exam: NAD Respiratory system: CTA Cardiovascular system: S 1, S 2 RRR Gastrointestinal system: BS present, soft, nt Central nervous system: Alert  Data Reviewed: I have personally reviewed following labs and imaging studies  CBC: Recent Labs  Lab 02/25/20 0500 02/26/20 0412 02/27/20 0500 02/28/20 0446 02/29/20 0241  WBC 6.2 6.9 7.1 5.8 6.4  NEUTROABS 4.1 5.4 5.0 3.8 4.5  HGB 7.5* 7.2* 7.3* 7.0* 6.6*  HCT 25.2* 22.5* 24.1* 21.2* 20.6*  MCV 75.0* 71.4* 73.0* 70.9* 70.1*  PLT 421* 490* 612* 546* 627*   Basic Metabolic Panel: Recent Labs  Lab 02/24/20 0524 02/25/20 0500 02/26/20 0412 02/27/20 0500 02/28/20 0446  NA 135 134* 137 136 136  K 4.0 4.3 4.1 3.8 3.9  CL 103 102 104 105 104  CO2 22 22 24 22 22   GLUCOSE 119* 171* 91 160* 149*  BUN 21 20 18 22 18   CREATININE 0.99 1.06* 0.89 1.05* 0.89  CALCIUM 8.1* 8.3* 8.5* 8.2* 8.3*  MG  --  1.9 2.0 2.0 2.0  PHOS  --  4.2 4.2 4.2 4.2   GFR: Estimated Creatinine Clearance: 64.2 mL/min (by C-G formula based on SCr of 0.89 mg/dL). Liver Function Tests: Recent Labs  Lab 02/23/20 0954 02/25/20 0500 02/26/20 0412 02/27/20 0500 02/28/20 0446  AST 18 18 14* 13* 13*  ALT 11 11 11 10 10   ALKPHOS 72 65 64 62 65  BILITOT 0.5 0.2* 0.5 0.3 0.4  PROT 8.2* 7.0 6.7 6.5 6.4*  ALBUMIN 2.9* 2.2* 2.2* 2.2* 2.2*   Recent Labs  Lab 02/23/20 0954  LIPASE 27    No results for input(s): AMMONIA in the last 168 hours. Coagulation Profile: No results for input(s): INR, PROTIME in the last 168 hours. Cardiac Enzymes: Recent Labs  Lab 02/23/20 0954  CKTOTAL 130   BNP (last 3 results) No results for input(s): PROBNP in the last 8760 hours. HbA1C: No results for input(s): HGBA1C in the last 72 hours. CBG: Recent Labs  Lab 02/28/20 0744 02/28/20 1149 02/28/20 1629 02/28/20 2237 02/29/20 0754  GLUCAP 126* 165* 180* 192* 139*   Lipid Profile: Recent Labs    02/27/20  0500  CHOL 119  HDL 27*  LDLCALC 83  TRIG 46  CHOLHDL 4.4   Thyroid Function Tests: No results for input(s): TSH, T4TOTAL, FREET4, T3FREE, THYROIDAB in the last 72 hours. Anemia Panel: No results for input(s): VITAMINB12, FOLATE, FERRITIN, TIBC, IRON, RETICCTPCT in the last 72 hours. Sepsis Labs: Recent Labs  Lab 02/24/20 1556  PROCALCITON <0.10    Recent Results (from the past 240 hour(s))  SARS CORONAVIRUS 2 (TAT 6-24 HRS) Nasopharyngeal Nasopharyngeal Swab     Status: Abnormal   Collection Time: 02/23/20 11:48 AM   Specimen: Nasopharyngeal Swab  Result Value Ref Range Status   SARS Coronavirus 2 POSITIVE (A) NEGATIVE Final    Comment: (NOTE) SARS-CoV-2 target nucleic acids are DETECTED.  The SARS-CoV-2 RNA is generally detectable in upper and lower respiratory specimens during the acute phase of infection. Positive results are indicative of the presence of SARS-CoV-2 RNA. Clinical correlation with Ruth Reynolds history and other diagnostic information is  necessary to determine Ruth Reynolds infection status. Positive results do not rule out bacterial infection or co-infection with other viruses.  The expected result is Negative.  Fact Sheet for Patients: HairSlick.no  Fact Sheet for Healthcare Providers: quierodirigir.com  This test is not yet approved or cleared by the [place] FDA and  has been  authorized for detection and/or diagnosis of SARS-CoV-2 by FDA under an Emergency Use Authorization (EUA). This EUA will remain  in effect (meaning this test can be used) for the duration of the COVID-19 declaration under Section 564(b)(1) of the Act, 21 U. S.C. section 360bbb-3(b)(1), unless the authorization is terminated or revoked sooner.   Performed at [facility], [address], [place], [place] [address]   Urine culture     Status: None   Collection Time: 02/23/20  3:10 PM   Specimen: Urine, Catheterized  Result Value Ref Range Status   Specimen Description URINE, CATHETERIZED  Final   Special Requests NONE  Final   Culture   Final    NO GROWTH Performed at [facility], [address], [place], [place] [address]    Report Status 02/24/2020 FINAL  Final         Radiology Studies: No results found.      Scheduled Meds: . sodium chloride   Intravenous Once  . vitamin C  500 mg Oral Daily  . aspirin EC  81 mg Oral Daily  . atorvastatin  40 mg Oral QPM  . clopidogrel  75 mg Oral Daily  . insulin aspart  0-15 Units Subcutaneous TID WC  . linagliptin  5 mg Oral Daily  . losartan  25 mg Oral Daily  . metoprolol succinate  25 mg Oral Daily  . multivitamin with minerals  1 tablet Oral Daily  . senna  1 tablet Oral BID  . vitamin B-12  100 mcg Oral Daily  . zinc sulfate  220 mg Oral Daily   Continuous Infusions: . ferumoxytol       LOS: 5 days    Time spent: 35 minutes    Layken Doenges A Juno Bozard, MD [facility]   If 7PM-7AM, please contact night-coverage [email or web address]  02/29/2020, 9:28 AM

## 2020-02-29 NOTE — Consult Note (Signed)
Telepsych Consultation   Reason for Consult: ''evaluation for capacity.''  Referring Physician:  Hartley Barefoot, MD Location of Patient: 2W Location of Provider: [facility]  Patient Identification: Ruth Reynolds MRN:  [id number] Principal Diagnosis: COVID-19 virus infection Diagnosis:  Principal Problem:   COVID-19 virus infection Active Problems:   Symptomatic anemia   Hyperglycemia due to diabetes mellitus (HCC)   Acute prerenal azotemia   Acute metabolic encephalopathy   Acute lower UTI   (HFpEF) heart failure with preserved ejection fraction (HCC)   Generalized weakness   Cerebral thrombosis with cerebral infarction   Total Time spent with patient: 45 minutes  Subjective:   Ruth Reynolds is a 70 y.o. female patient admitted due to fatigue and generalized weakness.  HPI:  70 y.o. female with a past medical history significant for Chronic anemia, SDH after a fall, HFpEF, Diabetes type 2 who was admitted to the hospital due to generalized weakness and fatigue. Psychiatric consult was called for ''Evaluation for Capacity" after patient refused blood transfusion due to critical level Hgb=6.6. Patient is alert, oriented to time, place, person and situation. She continues to refuse blood transfusion for personal reason even though she claim to be aware of possible consequences of not allowing blood transfusion. Patient denies prior history of mental illness, psychosis, delusions, depression and memory problem all which may interfere with her ability to process information. Patient scored 26/30 on mini-mental status examination.   Past Psychiatric History: None reported  Risk to Self:  denies Risk to Others:  denies Prior Inpatient Therapy:  none Prior Outpatient Therapy:  none  Past Medical History:  Past Medical History:  Diagnosis Date  . (HFpEF) heart failure with preserved ejection fraction (HCC) 2016   Echo with EF 55-60%, Grade II diastolic dysfxn  . DM2  (diabetes mellitus, type 2) (HCC)   . Iron deficiency anemia   . SDH (subdural hematoma) (HCC) 2019   after fall. Left. No sequellae    Past Surgical History:  Procedure Laterality Date  . ABDOMINAL HYSTERECTOMY    . ABDOMINAL HYSTERECTOMY     Family History:  Family History  Problem Relation Age of Onset  . Heart disease Father    Family Psychiatric  History:  Social History:  Social History   Substance and Sexual Activity  Alcohol Use Yes   Comment: occasionally     Social History   Substance and Sexual Activity  Drug Use No    Social History   Socioeconomic History  . Marital status: Single    Spouse name: Not on file  . Number of children: Not on file  . Years of education: Not on file  . Highest education level: Not on file  Occupational History  . Not on file  Tobacco Use  . Smoking status: Never Smoker  . Smokeless tobacco: Never Used  Substance and Sexual Activity  . Alcohol use: Yes    Comment: occasionally  . Drug use: No  . Sexual activity: Not on file  Other Topics Concern  . Not on file  Social History Narrative  . Not on file   Social Determinants of Health   Financial Resource Strain: Not on file  Food Insecurity: Not on file  Transportation Needs: Not on file  Physical Activity: Not on file  Stress: Not on file  Social Connections: Not on file   Additional Social History:    Allergies:  No Known Allergies  Labs:  Results for orders placed or performed during the hospital encounter  of 02/23/20 (from the past 48 hour(s))  Glucose, capillary     Status: Abnormal   Collection Time: 02/27/20  4:22 PM  Result Value Ref Range   Glucose-Capillary 108 (H) 70 - 99 mg/dL    Comment: Glucose reference range applies only to samples taken after fasting for at least 8 hours.  Glucose, capillary     Status: Abnormal   Collection Time: 02/27/20  8:58 PM  Result Value Ref Range   Glucose-Capillary 160 (H) 70 - 99 mg/dL    Comment: Glucose  reference range applies only to samples taken after fasting for at least 8 hours.  CBC with Differential/Platelet     Status: Abnormal   Collection Time: 02/28/20  4:46 AM  Result Value Ref Range   WBC 5.8 4.0 - 10.5 K/uL   RBC 2.99 (L) 3.87 - 5.11 MIL/uL   Hemoglobin 7.0 (L) 12.0 - 15.0 g/dL    Comment: Reticulocyte Hemoglobin testing may be clinically indicated, consider ordering this additional test [id number]    HCT 21.2 (L) 36.0 - 46.0 %   MCV 70.9 (L) 80.0 - 100.0 fL   MCH 23.4 (L) 26.0 - 34.0 pg   MCHC 33.0 30.0 - 36.0 g/dL   RDW 62.9 (H) 47.6 - 54.6 %   Platelets 546 (H) 150 - 400 K/uL   nRBC 0.3 (H) 0.0 - 0.2 %   Neutrophils Relative % 66 %   Neutro Abs 3.8 1.7 - 7.7 K/uL   Lymphocytes Relative 19 %   Lymphs Abs 1.1 0.7 - 4.0 K/uL   Monocytes Relative 13 %   Monocytes Absolute 0.8 0.1 - 1.0 K/uL   Eosinophils Relative 1 %   Eosinophils Absolute 0.1 0.0 - 0.5 K/uL   Basophils Relative 0 %   Basophils Absolute 0.0 0.0 - 0.1 K/uL   Immature Granulocytes 1 %   Abs Immature Granulocytes 0.04 0.00 - 0.07 K/uL   Schistocytes PRESENT    Tear Drop Cells PRESENT    Polychromasia PRESENT    Target Cells PRESENT     Comment: Performed at [facility], [address] [place], [place] [address]  Comprehensive metabolic panel     Status: Abnormal   Collection Time: 02/28/20  4:46 AM  Result Value Ref Range   Sodium 136 135 - 145 mmol/L   Potassium 3.9 3.5 - 5.1 mmol/L   Chloride 104 98 - 111 mmol/L   CO2 22 22 - 32 mmol/L   Glucose, Bld 149 (H) 70 - 99 mg/dL    Comment: Glucose reference range applies only to samples taken after fasting for at least 8 hours.   BUN 18 8 - 23 mg/dL   Creatinine, Ser 6.56 0.44 - 1.00 mg/dL   Calcium 8.3 (L) 8.9 - 10.3 mg/dL   Total Protein 6.4 (L) 6.5 - 8.1 g/dL   Albumin 2.2 (L) 3.5 - 5.0 g/dL   AST 13 (L) 15 - 41 U/L   ALT 10 0 - 44 U/L   Alkaline Phosphatase 65 38 - 126 U/L   Total Bilirubin 0.4 0.3 - 1.2 mg/dL   GFR, Estimated  >81 >27 mL/min    Comment: (NOTE) Calculated using the CKD-EPI Creatinine Equation (2021)    Anion gap 10 5 - 15    Comment: Performed at [facility], [address] [place], [place] [address]  C-reactive protein     Status: Abnormal   Collection Time: 02/28/20  4:46 AM  Result Value Ref  Range   CRP 11.0 (H) <1.0 mg/dL    Comment: Performed at [facility], [address] [place], [place] [address]  D-dimer, quantitative (not at [facility])     Status: Abnormal   Collection Time: 02/28/20  4:46 AM  Result Value Ref Range   D-Dimer, Quant 2.29 (H) 0.00 - 0.50 ug/mL-FEU    Comment: (NOTE) At the manufacturer cut-off value of 0.5 g/mL FEU, this assay has a negative predictive value of 95-100%.This assay is intended for use in conjunction with a clinical pretest probability (PTP) assessment model to exclude pulmonary embolism (PE) and deep venous thrombosis (DVT) in outpatients suspected of PE or DVT. Results should be correlated with clinical presentation. Performed at [facility], [address] [place], [place] [address]   Phosphorus     Status: None   Collection Time: 02/28/20  4:46 AM  Result Value Ref Range   Phosphorus 4.2 2.5 - 4.6 mg/dL    Comment: Performed at [facility], [address] [place], [place] [address]  Magnesium     Status: None   Collection Time: 02/28/20  4:46 AM  Result Value Ref Range   Magnesium 2.0 1.7 - 2.4 mg/dL    Comment: Performed at [facility], [address] [place], [place] [address]  Glucose, capillary     Status: Abnormal   Collection Time: 02/28/20  7:44 AM  Result Value Ref Range   Glucose-Capillary 126 (H) 70 - 99 mg/dL    Comment: Glucose reference range applies only to samples taken after fasting for at least 8 hours.  Glucose, capillary     Status: Abnormal   Collection Time: 02/28/20 11:49 AM  Result Value Ref Range   Glucose-Capillary 165 (H) 70 - 99 mg/dL    Comment: Glucose reference range  applies only to samples taken after fasting for at least 8 hours.  Glucose, capillary     Status: Abnormal   Collection Time: 02/28/20  4:29 PM  Result Value Ref Range   Glucose-Capillary 180 (H) 70 - 99 mg/dL    Comment: Glucose reference range applies only to samples taken after fasting for at least 8 hours.  Glucose, capillary     Status: Abnormal   Collection Time: 02/28/20 10:37 PM  Result Value Ref Range   Glucose-Capillary 192 (H) 70 - 99 mg/dL    Comment: Glucose reference range applies only to samples taken after fasting for at least 8 hours.  CBC with Differential/Platelet     Status: Abnormal   Collection Time: 02/29/20  2:41 AM  Result Value Ref Range   WBC 6.4 4.0 - 10.5 K/uL   RBC 2.94 (L) 3.87 - 5.11 MIL/uL   Hemoglobin 6.6 (LL) 12.0 - 15.0 g/dL    Comment: REPEATED TO VERIFY Reticulocyte Hemoglobin testing may be clinically indicated, consider ordering this additional test [id number] THIS CRITICAL RESULT HAS VERIFIED AND BEEN CALLED TO S. WORTH,RN BY TERRAN TYSOR ON 01 29 2022 AT 0409, AND HAS BEEN READ BACK.     HCT 20.6 (L) 36.0 - 46.0 %   MCV 70.1 (L) 80.0 - 100.0 fL   MCH 22.4 (L) 26.0 - 34.0 pg   MCHC 32.0 30.0 - 36.0 g/dL   RDW 64.419.9 (H) 03.411.5 - 74.215.5 %   Platelets 627 (H) 150 - 400 K/uL   nRBC 0.0 0.0 - 0.2 %   Neutrophils Relative % 70 %   Neutro Abs 4.5 1.7 - 7.7 K/uL  Lymphocytes Relative 15 %   Lymphs Abs 1.0 0.7 - 4.0 K/uL   Monocytes Relative 12 %   Monocytes Absolute 0.8 0.1 - 1.0 K/uL   Eosinophils Relative 2 %   Eosinophils Absolute 0.1 0.0 - 0.5 K/uL   Basophils Relative 0 %   Basophils Absolute 0.0 0.0 - 0.1 K/uL   Immature Granulocytes 1 %   Abs Immature Granulocytes 0.09 (H) 0.00 - 0.07 K/uL    Comment: Performed at [facility], [address] [place], [place] [address]  Glucose, capillary     Status: Abnormal   Collection Time: 02/29/20  7:54 AM  Result Value Ref Range   Glucose-Capillary 139 (H) 70 - 99 mg/dL    Comment:  Glucose reference range applies only to samples taken after fasting for at least 8 hours.  Type and screen [facility]     Status: None (Preliminary result)   Collection Time: 02/29/20  9:20 AM  Result Value Ref Range   ABO/RH(D) O POS    Antibody Screen POS    Sample Expiration 03/03/2020,2359    DAT, IgG NEG    Unit Number [id number]    Blood Component Type RED CELLS,LR    Unit division 00    Status of Unit ALLOCATED    Transfusion Status OK TO TRANSFUSE    Crossmatch Result COMPATIBLE    Unit Number [id number]    Blood Component Type RED CELLS,LR    Unit division 00    Status of Unit ALLOCATED    Transfusion Status OK TO TRANSFUSE    Crossmatch Result COMPATIBLE   Glucose, capillary     Status: Abnormal   Collection Time: 02/29/20 12:29 PM  Result Value Ref Range   Glucose-Capillary 224 (H) 70 - 99 mg/dL    Comment: Glucose reference range applies only to samples taken after fasting for at least 8 hours.    Medications:  Current Facility-Administered Medications  Medication Dose Route Frequency Provider Last Rate Last Admin  . 0.9 %  sodium chloride infusion (Manually program via Guardrails IV Fluids)   Intravenous Once Shalhoub, Deno Lunger, MD      . acetaminophen (TYLENOL) tablet 650 mg  650 mg Oral Q4H PRN Arnetha Courser, MD   650 mg at 02/24/20 1449  . ascorbic acid (VITAMIN C) tablet 500 mg  500 mg Oral Daily Arnetha Courser, MD   500 mg at 02/29/20 0827  . aspirin EC tablet 81 mg  81 mg Oral Daily Leda Gauze, NP   81 mg at 02/29/20 0827  . atorvastatin (LIPITOR) tablet 40 mg  40 mg Oral QPM Marvel Plan, MD   40 mg at 02/28/20 1756  . chlorpheniramine-HYDROcodone (TUSSIONEX) 10-8 MG/5ML suspension 5 mL  5 mL Oral Q12H PRN Arnetha Courser, MD      . clopidogrel (PLAVIX) tablet 75 mg  75 mg Oral Daily Kirby-Graham, Beather Arbour, NP   75 mg at 02/29/20 0826  . folic acid (FOLVITE) tablet 1 mg  1 mg Oral Daily Regalado, Belkys A, MD   1 mg at 02/29/20  1152  . guaiFENesin-dextromethorphan (ROBITUSSIN DM) 100-10 MG/5ML syrup 10 mL  10 mL Oral Q4H PRN Arnetha Courser, MD      . insulin aspart (novoLOG) injection 0-15 Units  0-15 Units Subcutaneous TID WC Norins, Rosalyn Gess, MD   2 Units at 02/29/20 [phone]  . Ipratropium-Albuterol (COMBIVENT) respimat 1 puff  1 puff Inhalation Q6H PRN Regalado, Belkys A, MD      .  linagliptin (TRADJENTA) tablet 5 mg  5 mg Oral Daily Regalado, Belkys A, MD   5 mg at 02/29/20 0828  . losartan (COZAAR) tablet 25 mg  25 mg Oral Daily Marjie Skiff E, PA-C   25 mg at 02/29/20 0827  . metoprolol succinate (TOPROL-XL) 24 hr tablet 25 mg  25 mg Oral Daily Marjie Skiff E, PA-C   25 mg at 02/29/20 6256  . multivitamin with minerals tablet 1 tablet  1 tablet Oral Daily Norins, Rosalyn Gess, MD   1 tablet at 02/29/20 [phone]  . senna (SENOKOT) tablet 8.6 mg  1 tablet Oral BID Jacques Navy, MD   8.6 mg at 02/29/20 0826  . traMADol (ULTRAM) tablet 50 mg  50 mg Oral Q8H PRN Norins, Rosalyn Gess, MD   50 mg at 02/25/20 1841  . vitamin B-12 (CYANOCOBALAMIN) tablet 100 mcg  100 mcg Oral Daily Regalado, Belkys A, MD   100 mcg at 02/29/20 0827  . zinc sulfate capsule 220 mg  220 mg Oral Daily Arnetha Courser, MD   220 mg at 02/29/20 7342    Musculoskeletal: Strength & Muscle Tone: Not tested Gait & Station: unable to stand Patient leans: N/A  Psychiatric Specialty Exam: Physical Exam Psychiatric:        Attention and Perception: Attention and perception normal.        Mood and Affect: Mood normal.        Speech: Speech normal.        Behavior: Behavior normal. Behavior is cooperative.        Thought Content: Thought content normal.        Cognition and Memory: Cognition and memory normal.     Review of Systems  Constitutional: Positive for fatigue.  HENT: Negative.   Eyes: Negative.   Respiratory: Negative.   Psychiatric/Behavioral: Positive for dysphoric mood.    Blood pressure (!) 160/72, pulse 67, temperature 98.4 F  (36.9 C), temperature source Oral, resp. rate 18, height 5\' 6"  (1.676 m), weight 81.6 kg, SpO2 100 %.Body mass index is 29.04 kg/m.  General Appearance: Casual  Eye Contact:  Fair  Speech:  Clear and Coherent  Volume:  Normal  Mood:  Dysphoric  Affect:  Appropriate  Thought Process:  Coherent and Linear  Orientation:  Full (Time, Place, and Person)  Thought Content:  Logical  Suicidal Thoughts:  No  Homicidal Thoughts:  No  Memory:  Immediate;   Good Recent;   Good Remote;   Fair  Judgement:  Other:  marginal  Insight:  Shallow  Psychomotor Activity:  Psychomotor Retardation  Concentration:  Concentration: Fair and Attention Span: Fair  Recall:  of Knowledge:  Fair  Language:  Good  Akathisia:  No  Handed:  Right  AIMS (if indicated):     Assets:  Communication Skills  ADL's:  Marginal  Cognition:  WNL  Sleep:        Treatment Plan Summary: 70 year old female with multiple medical problems who denies past history of mental illness. She was admitted for fatigue , generalized weakness and found to have low Hemoglobin but she refused blood transfusion. Patient is alert, awake, oriented x4 and denies any current psychiatric problems. She scored 26/30 on Mini-mental status examination,  verbalizes awareness of consequences of refusing treatment and takes responsibility for her refusal. Based on my assessment today, patient has Capacity to make inform medical decision but be aware that capacity changes from time to time.  Recommendations: -Consult with  family to determine if patient has Power of Attorney who can make medical decision on her behalf. -Consult with family member and hospital risk management committee to determine if patient is appropriate for competency evaluation by [place] legal system.   Disposition: Psychiatric service signing off. Re-consult as needed  This service was provided via telemedicine using a 2-way, interactive audio and video  technology.  Names of all persons participating in this telemedicine service and their role in this encounter. Name: Ruth Reynolds Role: Patient  Name: Hartley Barefoot, MD Role: Hospitalist  Name: Thedore Mins, MD Role: Psychiatrist    Thedore Mins, MD 02/29/2020 12:36 PM

## 2020-02-29 NOTE — Progress Notes (Signed)
CRITICAL VALUE ALERT  Critical Value:  Hgb=6.6  Date & Time Notied:  02/29/20  0419  Provider Notified: Leafy Half  Orders Received/Actions taken: awaiting response

## 2020-02-29 NOTE — Progress Notes (Addendum)
HOSPITAL MEDICINE OVERNIGHT EVENT NOTE    Notified by nursing that patient's hemoglobin this morning is 6.6.  Per nursing, patient denies any chest pain or shortness of breath.  Patient exhibits no evidence of gross bleeding.  Chart reviewed, patient has longstanding history of chronic iron deficiency anemia.  Hemoglobin was initially 7.8 on admission but has gradually slowly down trended throughout the hospitalization thus far.  Ordering type and screen followed by transfusion of 1 unit of packed red blood cells.  I also ordered a posttransfusion H&H.  Marinda Elk  MD [facility]   ADDENDUM (1/29 5:20AM)  Patient refusing blood despite explaining the benefits.  Patient is AAOx3.  Will revisit possibility of blood transfusion later this morning.    Deno Lunger Laurna Shetley

## 2020-02-29 NOTE — Progress Notes (Signed)
Received critical Hgb level of 6.6; orders received to transfuse PRBC's with a follow up H&H. Spoke with pt about her labwork and that she would need a transfusion to correct her low hgb. Pt was asked if she would consent to this intervention, and pt stated No, refused to elaborate why she declined. Staff attempted clarify importance of transfusion but pt continues to decline transfusion at this time. On call provider notified.

## 2020-03-01 DIAGNOSIS — U071 COVID-19: Secondary | ICD-10-CM | POA: Diagnosis not present

## 2020-03-01 LAB — GLUCOSE, CAPILLARY
Glucose-Capillary: 118 mg/dL — ABNORMAL HIGH (ref 70–99)
Glucose-Capillary: 135 mg/dL — ABNORMAL HIGH (ref 70–99)
Glucose-Capillary: 172 mg/dL — ABNORMAL HIGH (ref 70–99)
Glucose-Capillary: 147 mg/dL — ABNORMAL HIGH (ref 70–99)
Glucose-Capillary: 154 mg/dL — ABNORMAL HIGH (ref 70–99)

## 2020-03-01 LAB — HEMOGLOBIN AND HEMATOCRIT, BLOOD
HCT: 23.7 % — ABNORMAL LOW (ref 36.0–46.0)
Hemoglobin: 7.9 g/dL — ABNORMAL LOW (ref 12.0–15.0)

## 2020-03-01 MED ORDER — HYDRALAZINE HCL 25 MG PO TABS
25.0000 mg | ORAL_TABLET | Freq: Two times a day (BID) | ORAL | Status: DC
  Administered 2020-03-01 – 2020-03-10 (×19): 25 mg via ORAL
  Filled 2020-03-01 (×19): qty 1

## 2020-03-01 NOTE — Plan of Care (Signed)
  Problem: Education: Goal: Knowledge of risk factors and measures for prevention of condition will improve Outcome: Progressing   Problem: Coping: Goal: Psychosocial and spiritual needs will be supported Outcome: Progressing   Problem: Education: Goal: Knowledge of secondary prevention will improve Outcome: Progressing Goal: Knowledge of patient specific risk factors addressed and post discharge goals established will improve Outcome: Progressing Goal: Individualized Educational Video(s) Outcome: Progressing   Problem: Coping: Goal: Will identify appropriate support needs Outcome: Progressing   Problem: Self-Care: Goal: Ability to participate in self-care as condition permits will improve Outcome: Progressing   Problem: Nutrition: Goal: Risk of aspiration will decrease Outcome: Progressing   Problem: Ischemic Stroke/TIA Tissue Perfusion: Goal: Complications of ischemic stroke/TIA will be minimized Outcome: Progressing

## 2020-03-01 NOTE — Progress Notes (Addendum)
PROGRESS NOTE    Ruth Reynolds  QIO:[id number] DOB: 24-Oct-1950 DOA: 02/23/2020 PCP: Patient, No Pcp Per   Brief Narrative: 70 year old with past medical history significant for diabetes type 2, chronic anemia, history of SDH after a fall, HF PEF who presents via EMS after being found on the floor.  Patient states she was sleeping on the floor because her bed was filled with a magazine and she felt like that was the best place to fall asleep.  Admitting spoke with patient's niece who notes that patient fell yesterday and has been on the floor for roughly 24 hours.  Patient currently lives with her niece, who notes that she attempted to help patient up however patient did not want any help and continue to lay on the floor for roughly 1 day.  Niece reports generalized weakness over the past few weeks and increased fatigue.  Incontinence episode.  Over the past few months patient has been ambulated with a cane.  She will require skilled nursing placement   Assessment & Plan:   Principal Problem:   COVID-19 virus infection Active Problems:   Symptomatic anemia   Hyperglycemia due to diabetes mellitus (HCC)   Acute prerenal azotemia   Acute metabolic encephalopathy   Acute lower UTI   (HFpEF) heart failure with preserved ejection fraction (HCC)   Generalized weakness   Cerebral thrombosis with cerebral infarction   1-COVID-19 infection: Chest x-ray with some concern for infiltrate, saturation well on room air. Received 3 days of Remdesivir.  Stable.  Covid positive 1/23. Needs 10 days of quarantine.   COVID-19 Labs  Recent Labs    02/28/20 0446  DDIMER 2.29*  CRP 11.0*    Lab Results  Component Value Date   SARSCOV2NAA POSITIVE (A) 02/23/2020   UTI: On Cipro for concern for UTI.  Urine culture negative.  Antibiotics discontinue.  Fall/generalized weakness: PT recommending skilled nursing facility  Diabetes type 2: She was a started on Tradjenta. continue with Lantus sliding  scale insulin  AKI: Resolved with hydration  Acute- sub acute stroke: Acute metabolic encephalopathy: Patient is alert, slowly answering questions -MRI positive for stroke. 7 mm focus diffusion abnormality involving right paramedian pons, consistent with an acute to sub acute infarct. Multiples remote lacunar infarct.  -LDL 83, start statins.  -Hb A1c. 9.8 -Started on aspirin and plavix. Needs plavix aspirin for 3 weeks, then aspirin alone.  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   Iron  deficiency anemia: Started on iron supplement Hb decreased to 6.6.  Received IV iron.  She agreed to one unit PRBC>  Hb increase to 7.8 Start oral iron.  Needs colonoscopy out patient.   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  Diastolic dysfunction On Coozar, and toprol.  Cardiology will arrange  follow up out patient.  Elevated D dimer likely related to Covid. Patient denies chest pain.   HTN; started  metoprolol./  Resume lisinopril. HCTZ due to AKI.  Will add hydralazine to help with BP controlled.   Stable awaiting placement.   Estimated body mass index is 29.04 kg/m as calculated from the following:   Height as of this encounter: 5\' 6"  (1.676 m).   Weight as of this encounter: 81.6 kg.  DVT prophylaxis: Lovenox Code Status: Full code Family Communication: niece and sister over phone. 1/26 family 1/29 Disposition Plan:  Status is: Inpatient  Remains inpatient appropriate  because:IV treatments appropriate due to intensity of illness or inability to take PO   Dispo: The patient is from: Home              Anticipated d/c is to: SNF              Anticipated d/c date is: 1 day              Patient currently is medically stable to d/c. Awaiting SNF   Difficult to place patient No        Consultants:   none  Procedures:    none  Antimicrobials:    Subjective: She is feeling better.  Yesterday she agreed to have blood transfusion.  Denies melena or hematochezia.    Objective: Vitals:   02/29/20 1941 02/29/20 2139 03/01/20 0316 03/01/20 0948  BP: (!) 155/70 (!) 144/79 (!) 155/72 (!) 155/69  Pulse: 77 79 67 73  Resp: 14 18 14 16   Temp: 98.2 F (36.8 C) 98.1 F (36.7 C) 98 F (36.7 C) 98 F (36.7 C)  TempSrc:  Oral  Oral  SpO2: 97% 100% 98% 100%  Weight:      Height:        Intake/Output Summary (Last 24 hours) at 03/01/2020 1218 Last data filed at 02/29/2020 2115 Gross per 24 hour  Intake 474 ml  Output -  Net 474 ml   Filed Weights   02/26/20 0800  Weight: 81.6 kg    Examination:  General exam: NAD Respiratory system: CTA Cardiovascular system: S 1, S 2 RRR Gastrointestinal system: BS present, soft, nt Central nervous system: alert, follows command more interactive  Data Reviewed: I have personally reviewed following labs and imaging studies  CBC: Recent Labs  Lab 02/25/20 0500 02/26/20 0412 02/27/20 0500 02/28/20 0446 02/29/20 0241 03/01/20 0012  WBC 6.2 6.9 7.1 5.8 6.4  --   NEUTROABS 4.1 5.4 5.0 3.8 4.5  --   HGB 7.5* 7.2* 7.3* 7.0* 6.6* 7.9*  HCT 25.2* 22.5* 24.1* 21.2* 20.6* 23.7*  MCV 75.0* 71.4* 73.0* 70.9* 70.1*  --   PLT 421* 490* 612* 546* 627*  --    Basic Metabolic Panel: Recent Labs  Lab 02/24/20 0524 02/25/20 0500 02/26/20 0412 02/27/20 0500 02/28/20 0446  NA 135 134* 137 136 136  K 4.0 4.3 4.1 3.8 3.9  CL 103 102 104 105 104  CO2 22 22 24 22 22   GLUCOSE 119* 171* 91 160* 149*  BUN 21 20 18 22 18   CREATININE 0.99 1.06* 0.89 1.05* 0.89  CALCIUM 8.1* 8.3* 8.5* 8.2* 8.3*  MG  --  1.9 2.0 2.0 2.0  PHOS  --  4.2 4.2 4.2 4.2   GFR: Estimated Creatinine Clearance: 64.2 mL/min (by C-G formula based on SCr of 0.89 mg/dL). Liver Function Tests: Recent Labs  Lab 02/25/20 0500 02/26/20 0412 02/27/20 0500 02/28/20 0446  AST 18 14* 13* 13*   ALT 11 11 10 10   ALKPHOS 65 64 62 65  BILITOT 0.2* 0.5 0.3 0.4  PROT 7.0 6.7 6.5 6.4*  ALBUMIN 2.2* 2.2* 2.2* 2.2*   No results for input(s): LIPASE, AMYLASE in the last 168 hours. No results for input(s): AMMONIA in the last 168 hours. Coagulation Profile: No results for input(s): INR, PROTIME in the last 168 hours. Cardiac Enzymes: No results for input(s): CKTOTAL, CKMB, CKMBINDEX, TROPONINI in the last 168 hours. BNP (last 3 results) No results for input(s): PROBNP in the last 8760 hours. HbA1C: No results for input(s):  HGBA1C in the last 72 hours. CBG: Recent Labs  Lab 02/29/20 1648 02/29/20 1936 03/01/20 0803 03/01/20 0943 03/01/20 1208  GLUCAP 153* 176* 118* 135* 172*   Lipid Profile: No results for input(s): CHOL, HDL, LDLCALC, TRIG, CHOLHDL, LDLDIRECT in the last 72 hours. Thyroid Function Tests: No results for input(s): TSH, T4TOTAL, FREET4, T3FREE, THYROIDAB in the last 72 hours. Anemia Panel: No results for input(s): VITAMINB12, FOLATE, FERRITIN, TIBC, IRON, RETICCTPCT in the last 72 hours. Sepsis Labs: Recent Labs  Lab 02/24/20 1556  PROCALCITON <0.10    Recent Results (from the past 240 hour(s))  SARS CORONAVIRUS 2 (TAT 6-24 HRS) Nasopharyngeal Nasopharyngeal Swab     Status: Abnormal   Collection Time: 02/23/20 11:48 AM   Specimen: Nasopharyngeal Swab  Result Value Ref Range Status   SARS Coronavirus 2 POSITIVE (A) NEGATIVE Final    Comment: (NOTE) SARS-CoV-2 target nucleic acids are DETECTED.  The SARS-CoV-2 RNA is generally detectable in upper and lower respiratory specimens during the acute phase of infection. Positive results are indicative of the presence of SARS-CoV-2 RNA. Clinical correlation with patient history and other diagnostic information is  necessary to determine patient infection status. Positive results do not rule out bacterial infection or co-infection with other viruses.  The expected result is Negative.  Fact Sheet for  Patients: HairSlick.no  Fact Sheet for Healthcare Providers: quierodirigir.com  This test is not yet approved or cleared by the [place] FDA and  has been authorized for detection and/or diagnosis of SARS-CoV-2 by FDA under an Emergency Use Authorization (EUA). This EUA will remain  in effect (meaning this test can be used) for the duration of the COVID-19 declaration under Section 564(b)(1) of the Act, 21 U. S.C. section 360bbb-3(b)(1), unless the authorization is terminated or revoked sooner.   Performed at [facility], [address] [place], [place] [address]   Urine culture     Status: None   Collection Time: 02/23/20  3:10 PM   Specimen: Urine, Catheterized  Result Value Ref Range Status   Specimen Description URINE, CATHETERIZED  Final   Special Requests NONE  Final   Culture   Final    NO GROWTH Performed at [facility], [address] [place], [place] [address]    Report Status 02/24/2020 FINAL  Final         Radiology Studies: No results found.      Scheduled Meds: . sodium chloride   Intravenous Once  . vitamin C  500 mg Oral Daily  . aspirin EC  81 mg Oral Daily  . atorvastatin  40 mg Oral QPM  . clopidogrel  75 mg Oral Daily  . folic acid  1 mg Oral Daily  . insulin aspart  0-15 Units Subcutaneous TID WC  . linagliptin  5 mg Oral Daily  . losartan  25 mg Oral Daily  . metoprolol succinate  25 mg Oral Daily  . multivitamin with minerals  1 tablet Oral Daily  . senna  1 tablet Oral BID  . vitamin B-12  100 mcg Oral Daily  . zinc sulfate  220 mg Oral Daily   Continuous Infusions:    LOS: 6 days    Time spent: 35 minutes    Debrah Granderson A Salle Brandle, MD [facility]   If 7PM-7AM, please contact night-coverage [email or web address]  03/01/2020, 12:18 PM

## 2020-03-02 ENCOUNTER — Telehealth: Payer: Self-pay | Admitting: General Practice

## 2020-03-02 DIAGNOSIS — U071 COVID-19: Secondary | ICD-10-CM | POA: Diagnosis not present

## 2020-03-02 LAB — GLUCOSE, CAPILLARY
Glucose-Capillary: 109 mg/dL — ABNORMAL HIGH (ref 70–99)
Glucose-Capillary: 144 mg/dL — ABNORMAL HIGH (ref 70–99)
Glucose-Capillary: 150 mg/dL — ABNORMAL HIGH (ref 70–99)
Glucose-Capillary: 169 mg/dL — ABNORMAL HIGH (ref 70–99)

## 2020-03-02 LAB — HEMOGLOBIN AND HEMATOCRIT, BLOOD
HCT: 29.9 % — ABNORMAL LOW (ref 36.0–46.0)
Hemoglobin: 9.6 g/dL — ABNORMAL LOW (ref 12.0–15.0)

## 2020-03-02 NOTE — Progress Notes (Signed)
PROGRESS NOTE    Ruth Reynolds  OAC:[id number] DOB: August 20, 1950 DOA: 02/23/2020 PCP: Patient, No Pcp Per   Brief Narrative: 70 year old with past medical history significant for diabetes type 2, chronic anemia, history of SDH after a fall, HF PEF who presents via EMS after being found on the floor.  Patient states she was sleeping on the floor because her bed was filled with a magazine and she felt like that was the best place to fall asleep.  Admitting spoke with patient's niece who notes that patient fell yesterday and has been on the floor for roughly 24 hours.  Patient currently lives with her niece, who notes that she attempted to help patient up however patient did not want any help and continue to lay on the floor for roughly 1 day.  Niece reports generalized weakness over the past few weeks and increased fatigue.  Incontinence episode.  Over the past few months patient has been ambulated with a cane.  She will require skilled nursing placement   Assessment & Plan:   Principal Problem:   COVID-19 virus infection Active Problems:   Symptomatic anemia   Hyperglycemia due to diabetes mellitus (HCC)   Acute prerenal azotemia   Acute metabolic encephalopathy   Acute lower UTI   (HFpEF) heart failure with preserved ejection fraction (HCC)   Generalized weakness   Cerebral thrombosis with cerebral infarction   1-COVID-19 infection: Chest x-ray with some concern for infiltrate, saturation well on room air. Received 3 days of Remdesivir.  Stable.  Covid positive 1/23. Needs 10 days of quarantine.  Can be off quarantine 2-3.  COVID-19 Labs  No results for input(s): DDIMER, FERRITIN, LDH, CRP in the last 72 hours.  Lab Results  Component Value Date   SARSCOV2NAA POSITIVE (A) 02/23/2020   UTI: On Cipro for concern for UTI.  Urine culture negative.  Antibiotics discontinue.  Fall/generalized weakness: PT recommending skilled nursing facility  Diabetes type 2: She was a started  on Tradjenta. continue with Lantus sliding scale insulin  AKI: Resolved with hydration  Acute- sub acute stroke: Acute metabolic encephalopathy: Patient is alert, slowly answering questions -MRI positive for stroke. 7 mm focus diffusion abnormality involving right paramedian pons, consistent with an acute to sub acute infarct. Multiples remote lacunar infarct.  -LDL 83, start statins.  -Hb A1c. 9.8 -Started on aspirin and plavix. Needs plavix aspirin for 3 weeks, then aspirin alone.  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   Iron  deficiency anemia: Started on iron supplement Hb decreased to 6.6.  Received IV iron.  She agreed to one unit PRBC> transfuse 1/30. Needs colonoscopy out patient.  Repeated hb at 9.   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  Diastolic dysfunction On Coozar, and toprol.  Cardiology will arrange  follow up out patient.  Elevated D dimer likely related to Covid. Patient denies chest pain.   HTN; started  metoprolol./  Resume lisinopril. HCTZ due to AKI.  continue with hydralazine to help with BP controlled.   Stable awaiting placement.   Estimated body mass index is 29.04 kg/m as calculated from the following:   Height as of this encounter: 5\' 6"  (1.676 m).   Weight as of this encounter: 81.6 kg.  DVT prophylaxis: Lovenox Code Status: Full code Family Communication: niece and sister over phone. 1/26 family 1/29 Disposition Plan:  Status is: Inpatient  Remains inpatient  appropriate because:IV treatments appropriate due to intensity of illness or inability to take PO   Dispo: The patient is from: Home              Anticipated d/c is to: SNF              Anticipated d/c date is: 1 day              Patient currently is medically stable to d/c. Awaiting SNF   Difficult to place patient  No        Consultants:   none  Procedures:   none  Antimicrobials:    Subjective: No new complaints. She is more alert and conversant.    Objective: Vitals:   03/01/20 0948 03/01/20 1653 03/01/20 2105 03/02/20 0935  BP: (!) 155/69 (!) 141/79 (!) 151/85 139/70  Pulse: 73 92 92 79  Resp: 16 16 18 18   Temp: 98 F (36.7 C) 98.7 F (37.1 C) 97.9 F (36.6 C) 97.8 F (36.6 C)  TempSrc: Oral Oral Oral Oral  SpO2: 100% 100% 100% 100%  Weight:      Height:       No intake or output data in the 24 hours ending 03/02/20 1359 Filed Weights   02/26/20 0800  Weight: 81.6 kg    Examination:  General exam: NAD Respiratory system: CTA Cardiovascular system: S 1, S 2 RRR Gastrointestinal system: BS present, soft, nt Central nervous system; alert, oriented times 3.   Data Reviewed: I have personally reviewed following labs and imaging studies  CBC: Recent Labs  Lab 02/25/20 0500 02/26/20 0412 02/27/20 0500 02/28/20 0446 02/29/20 0241 03/01/20 0012 03/02/20 0901  WBC 6.2 6.9 7.1 5.8 6.4  --   --   NEUTROABS 4.1 5.4 5.0 3.8 4.5  --   --   HGB 7.5* 7.2* 7.3* 7.0* 6.6* 7.9* 9.6*  HCT 25.2* 22.5* 24.1* 21.2* 20.6* 23.7* 29.9*  MCV 75.0* 71.4* 73.0* 70.9* 70.1*  --   --   PLT 421* 490* 612* 546* 627*  --   --    Basic Metabolic Panel: Recent Labs  Lab 02/25/20 0500 02/26/20 0412 02/27/20 0500 02/28/20 0446  NA 134* 137 136 136  K 4.3 4.1 3.8 3.9  CL 102 104 105 104  CO2 22 24 22 22   GLUCOSE 171* 91 160* 149*  BUN 20 18 22 18   CREATININE 1.06* 0.89 1.05* 0.89  CALCIUM 8.3* 8.5* 8.2* 8.3*  MG 1.9 2.0 2.0 2.0  PHOS 4.2 4.2 4.2 4.2   GFR: Estimated Creatinine Clearance: 64.2 mL/min (by C-G formula based on SCr of 0.89 mg/dL). Liver Function Tests: Recent Labs  Lab 02/25/20 0500 02/26/20 0412 02/27/20 0500 02/28/20 0446  AST 18 14* 13* 13*  ALT 11 11 10 10   ALKPHOS 65 64 62 65  BILITOT 0.2* 0.5 0.3 0.4  PROT 7.0 6.7 6.5 6.4*  ALBUMIN 2.2* 2.2*  2.2* 2.2*   No results for input(s): LIPASE, AMYLASE in the last 168 hours. No results for input(s): AMMONIA in the last 168 hours. Coagulation Profile: No results for input(s): INR, PROTIME in the last 168 hours. Cardiac Enzymes: No results for input(s): CKTOTAL, CKMB, CKMBINDEX, TROPONINI in the last 168 hours. BNP (last 3 results) No results for input(s): PROBNP in the last 8760 hours. HbA1C: No results for input(s): HGBA1C in the last 72 hours. CBG: Recent Labs  Lab 03/01/20 1208 03/01/20 1657 03/01/20 2129 03/02/20 0730 03/02/20 1152  GLUCAP 172* 147* 154* 109* 144*  Lipid Profile: No results for input(s): CHOL, HDL, LDLCALC, TRIG, CHOLHDL, LDLDIRECT in the last 72 hours. Thyroid Function Tests: No results for input(s): TSH, T4TOTAL, FREET4, T3FREE, THYROIDAB in the last 72 hours. Anemia Panel: No results for input(s): VITAMINB12, FOLATE, FERRITIN, TIBC, IRON, RETICCTPCT in the last 72 hours. Sepsis Labs: Recent Labs  Lab 02/24/20 1556  PROCALCITON <0.10    Recent Results (from the past 240 hour(s))  SARS CORONAVIRUS 2 (TAT 6-24 HRS) Nasopharyngeal Nasopharyngeal Swab     Status: Abnormal   Collection Time: 02/23/20 11:48 AM   Specimen: Nasopharyngeal Swab  Result Value Ref Range Status   SARS Coronavirus 2 POSITIVE (A) NEGATIVE Final    Comment: (NOTE) SARS-CoV-2 target nucleic acids are DETECTED.  The SARS-CoV-2 RNA is generally detectable in upper and lower respiratory specimens during the acute phase of infection. Positive results are indicative of the presence of SARS-CoV-2 RNA. Clinical correlation with patient history and other diagnostic information is  necessary to determine patient infection status. Positive results do not rule out bacterial infection or co-infection with other viruses.  The expected result is Negative.  Fact Sheet for Patients: HairSlick.no  Fact Sheet for Healthcare  Providers: quierodirigir.com  This test is not yet approved or cleared by the [place] FDA and  has been authorized for detection and/or diagnosis of SARS-CoV-2 by FDA under an Emergency Use Authorization (EUA). This EUA will remain  in effect (meaning this test can be used) for the duration of the COVID-19 declaration under Section 564(b)(1) of the Act, 21 U. S.C. section 360bbb-3(b)(1), unless the authorization is terminated or revoked sooner.   Performed at [facility], [address], [place], [place] [address]   Urine culture     Status: None   Collection Time: 02/23/20  3:10 PM   Specimen: Urine, Catheterized  Result Value Ref Range Status   Specimen Description URINE, CATHETERIZED  Final   Special Requests NONE  Final   Culture   Final    NO GROWTH Performed at [facility], [address], [place], [place] [address]    Report Status 02/24/2020 FINAL  Final         Radiology Studies: No results found.      Scheduled Meds: . sodium chloride   Intravenous Once  . vitamin C  500 mg Oral Daily  . aspirin EC  81 mg Oral Daily  . atorvastatin  40 mg Oral QPM  . clopidogrel  75 mg Oral Daily  . folic acid  1 mg Oral Daily  . hydrALAZINE  25 mg Oral BID  . insulin aspart  0-15 Units Subcutaneous TID WC  . linagliptin  5 mg Oral Daily  . losartan  25 mg Oral Daily  . metoprolol succinate  25 mg Oral Daily  . multivitamin with minerals  1 tablet Oral Daily  . senna  1 tablet Oral BID  . vitamin B-12  100 mcg Oral Daily  . zinc sulfate  220 mg Oral Daily   Continuous Infusions:    LOS: 7 days    Time spent: 35 minutes    Belkys A Regalado, MD [facility]   If 7PM-7AM, please contact night-coverage [email or web address]  03/02/2020, 1:59 PM

## 2020-03-02 NOTE — TOC Progression Note (Signed)
Transition of Care [facility]) - Progression Note    Patient Details  Name: Ruth Reynolds MRN: [id number] Date of Birth: 1950/08/30  [facility]) CM/SW Contact  Beckie Busing, RN Phone Number: [phone]  03/02/2020, 1:28 PM  Clinical Narrative:    CM called [facility] to follow up on possible bed offer. Spoke with Lasha who confirmed that the facility currently does not have any COVID beds. The facility will possibly have covid beds on 03/04/20. TOC will continue to follow.   Expected Discharge Plan: Skilled Nursing Facility Barriers to Discharge: Continued Medical Work up  Expected Discharge Plan and Services Expected Discharge Plan: Skilled Nursing Facility    Living arrangements for the past 2 months: Single Family Home                 DME Arranged: N/A DME Agency: NA                   Social Determinants of Health (SDOH) Interventions    Readmission Risk Interventions No flowsheet data found.

## 2020-03-02 NOTE — Telephone Encounter (Signed)
Called patient and [facility].   I was calling to advise that her appointment for 03/24/20 had been cancelled due to the provider being out of the office. Please reschedule.

## 2020-03-03 DIAGNOSIS — U071 COVID-19: Secondary | ICD-10-CM | POA: Diagnosis not present

## 2020-03-03 LAB — GLUCOSE, CAPILLARY
Glucose-Capillary: 131 mg/dL — ABNORMAL HIGH (ref 70–99)
Glucose-Capillary: 130 mg/dL — ABNORMAL HIGH (ref 70–99)
Glucose-Capillary: 63 mg/dL — ABNORMAL LOW (ref 70–99)
Glucose-Capillary: 74 mg/dL (ref 70–99)
Glucose-Capillary: 250 mg/dL — ABNORMAL HIGH (ref 70–99)

## 2020-03-03 MED ORDER — ASPIRIN 81 MG PO TBEC: 81.0000 mg | DELAYED_RELEASE_TABLET | Freq: Every day | ORAL | 11 refills | Status: AC

## 2020-03-03 MED ORDER — CYANOCOBALAMIN 100 MCG PO TABS: 100.0000 ug | ORAL_TABLET | Freq: Every day | ORAL | 0 refills | Status: AC

## 2020-03-03 MED ORDER — CLOPIDOGREL BISULFATE 75 MG PO TABS: 75.0000 mg | ORAL_TABLET | Freq: Every day | ORAL | 0 refills | Status: AC

## 2020-03-03 MED ORDER — ATORVASTATIN CALCIUM 40 MG PO TABS: 40.0000 mg | ORAL_TABLET | Freq: Every evening | ORAL | 0 refills | Status: AC

## 2020-03-03 MED ORDER — ASCORBIC ACID 500 MG PO TABS: 500.0000 mg | ORAL_TABLET | Freq: Every day | ORAL | 0 refills | Status: AC

## 2020-03-03 MED ORDER — SENNA 8.6 MG PO TABS: 1.0000 | ORAL_TABLET | Freq: Two times a day (BID) | ORAL | 0 refills | Status: AC

## 2020-03-03 MED ORDER — FOLIC ACID 1 MG PO TABS: 1.0000 mg | ORAL_TABLET | Freq: Every day | ORAL | 0 refills | Status: AC

## 2020-03-03 MED ORDER — METOPROLOL SUCCINATE ER 25 MG PO TB24: 25.0000 mg | ORAL_TABLET | Freq: Every day | ORAL | 3 refills | Status: AC

## 2020-03-03 MED ORDER — ADULT MULTIVITAMIN W/MINERALS CH: 1.0000 | ORAL_TABLET | Freq: Every day | ORAL | 0 refills | Status: AC

## 2020-03-03 MED ORDER — HYDRALAZINE HCL 25 MG PO TABS: 25.0000 mg | ORAL_TABLET | Freq: Two times a day (BID) | ORAL | 0 refills | Status: AC

## 2020-03-03 MED ORDER — ZINC SULFATE 220 (50 ZN) MG PO CAPS: 220.0000 mg | ORAL_CAPSULE | Freq: Every day | ORAL | 0 refills | Status: AC

## 2020-03-03 MED ORDER — LOSARTAN POTASSIUM 25 MG PO TABS: 25.0000 mg | ORAL_TABLET | Freq: Every day | ORAL | 3 refills | Status: AC

## 2020-03-03 MED ORDER — LINAGLIPTIN 5 MG PO TABS: 5.0000 mg | ORAL_TABLET | Freq: Every day | ORAL | 3 refills | Status: AC

## 2020-03-03 NOTE — Progress Notes (Signed)
Physical Therapy Treatment Patient Details Name: Ruth Reynolds MRN: [id number] DOB: Jul 15, 1950 Today's Date: 03/03/2020    History of Present Illness Pt is a 70 y/o female admitted after being on floor for a day, pt states she fell then slept there and refused to get up. Found to be COVID + with encephalopathy. PMH includes DM, anemia, CHF, and SDH.    PT Comments    Pt is progressing with gait and mobility. She was able to walk further down the hallway today, however, cognition remains impaired and gait speed and safe use of RW (used nothing or cane before) are putting her at a high fall risk.  She remains appropriate for short term SNF stay for rehab before returning home.  PT will continue to follow acutely for safe mobility progression.  Goals updated.    Follow Up Recommendations  SNF     Equipment Recommendations  Rolling walker with 5" wheels    Recommendations for Other Services       Precautions / Restrictions Precautions Precautions: Fall    Mobility  Bed Mobility                  Transfers Overall transfer level: Needs assistance Equipment used: Rolling walker (2 wheeled)   Sit to Stand: Min guard         General transfer comment: min guard assist to stablize RW during transition of hands from chair rail to RW  Ambulation/Gait Ambulation/Gait assistance: Min guard Gait Distance (Feet): 100 Feet Assistive device: Rolling walker (2 wheeled) Gait Pattern/deviations: Step-through pattern;Decreased stride length;Trunk flexed Gait velocity: Decreased Gait velocity interpretation: <1.8 ft/sec, indicate of risk for recurrent falls General Gait Details: cues for closer proximity to RW especially when turning, min guard assist for balance during gait.  Speed slowed the further we went.   Stairs             Wheelchair Mobility    Modified Rankin (Stroke Patients Only)       Balance Overall balance assessment: Needs assistance Sitting-balance  support: No upper extremity supported;Feet supported Sitting balance-Leahy Scale: Good     Standing balance support: Bilateral upper extremity supported;Single extremity supported;No upper extremity supported Standing balance-Leahy Scale: Fair                              Cognition Arousal/Alertness: Awake/alert Behavior During Therapy: WFL for tasks assessed/performed Overall Cognitive Status: No family/caregiver present to determine baseline cognitive functioning                     Current Attention Level: Selective   Following Commands: Follows one step commands with increased time     Problem Solving: Slow processing        Exercises General Exercises - Upper Extremity Shoulder Flexion: AROM;Both;10 reps General Exercises - Lower Extremity Ankle Circles/Pumps: AROM;Both;10 reps Long Arc Quad: AROM;Both;10 reps Hip Flexion/Marching: AROM;Both;10 reps;Seated Other Exercises Other Exercises: incentive spirometer x10 max inspired volume 600 mL cues for technique    General Comments        Pertinent Vitals/Pain Pain Assessment: No/denies pain    Home Living                      Prior Function            PT Goals (current goals can now be found in the care plan section) Acute Rehab PT Goals PT Goal  Formulation: With patient Time For Goal Achievement: 03/17/20 Potential to Achieve Goals: Good Progress towards PT goals: Progressing toward goals    Frequency    Min 2X/week      PT Plan Current plan remains appropriate    Co-evaluation              AM-PAC PT "6 Clicks" Mobility   Outcome Measure  Help needed turning from your back to your side while in a flat bed without using bedrails?: A Little Help needed moving from lying on your back to sitting on the side of a flat bed without using bedrails?: A Little Help needed moving to and from a bed to a chair (including a wheelchair)?: A Little Help needed standing up  from a chair using your arms (e.g., wheelchair or bedside chair)?: A Little Help needed to walk in hospital room?: A Little Help needed climbing 3-5 steps with a railing? : A Little 6 Click Score: 18    End of Session Equipment Utilized During Treatment: Gait belt Activity Tolerance: Patient limited by fatigue Patient left: in chair;with call bell/phone within reach;with chair alarm set Nurse Communication: Mobility status PT Visit Diagnosis: Unsteadiness on feet (R26.81);Muscle weakness (generalized) (M62.81);History of falling (Z91.81)     Time: 5625-6389 PT Time Calculation (min) (ACUTE ONLY): 23 min  Charges:  $Gait Training: 8-22 mins $Therapeutic Exercise: 8-22 mins                    Corinna Capra, PT, DPT  Acute Rehabilitation [phone] pager [phone]) [phone] office

## 2020-03-03 NOTE — Discharge Summary (Signed)
Physician Discharge Summary  Ruth Reynolds JKD:[id number] DOB: 10/31/1950 DOA: 02/23/2020  PCP: Patient, No Pcp Per  Admit date: 02/23/2020 Discharge date: 03/03/2020  Admitted From: Home  Disposition: SNF  Recommendations for Outpatient Follow-up:  1. Follow up with PCP in 1-2 weeks 2. Please obtain BMP/CBC in one week 3. Needs to follow up with cardiology 2/22 for further care of new onset heart failure.  4. She will need competency evaluation.  5. She will need colonoscopy to evaluate for anemia    Discharge Condition: Stable.  CODE STATUS: Full code Diet recommendation: Carb Modified  Brief/Interim Summary: 70 year old with past medical history significant for diabetes type 2, chronic anemia, history of SDH after a fall, HF PEF who presents via EMS after being found on the floor.  Patient states she was sleeping on the floor because her bed was filled with  magazine and she felt like that was the best place to fall asleep.  Admitting spoke with patient's niece who notes that patient fell day prior to admission  and has been on the floor for roughly 24 hours.  Patient currently lives with her niece, who notes that she attempted to help patient up however patient did not want any help and continue to lay on the floor for roughly 1 day.  Niece reports generalized weakness over the past few weeks and increased fatigue.  Incontinence episode.  Over the past few months patient has been ambulated with a cane.  Patient admitted with Covid, no significant respiratory symptoms, she presented with acute  metabolic encephalopathy, stroke, and new cardiomyopathy./  She needs to be on plavix and aspirin for 3 weeks then aspirin alone.   She will require skilled nursing placement   1-COVID-19 infection: Chest x-ray with some concern for infiltrate, saturation well on room air. Received 3 days of Remdesivir.  Stable.  Covid positive 1/23. Needs 10 days of quarantine.  Can be off quarantine  2-3.  COVID-19 Labs  Recent Labs (last 2 labs)   No results for input(s): DDIMER, FERRITIN, LDH, CRP in the last 72 hours.    Recent Labs       Lab Results  Component Value Date   SARSCOV2NAA POSITIVE (A) 02/23/2020     UTI: On Cipro for concern for UTI.  Urine culture negative.  Antibiotics discontinue.  Fall/generalized weakness: PT recommending skilled nursing facility  Diabetes type 2: She was a started on Tradjenta.  Received SSI.   AKI: Resolved with hydration  Acute- sub acute stroke: Acute metabolic encephalopathy: Patient is alert, slowly answering questions -MRI positive for stroke. 7 mm focus diffusion abnormality involving right paramedian pons, consistent with an acute to sub acute infarct. Multiples remote lacunar infarct.  -LDL 83, start statins.  -Hb A1c. 9.8 -Started on aspirin and plavix. Needs plavix aspirin for 3 weeks, then aspirin alone.  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   Iron  deficiency anemia: Started on iron supplement Hb decreased to 6.6.  Received IV iron.  She agreed to one unit PRBC> transfuse 1/30. Needs colonoscopy out patient.  Repeated hb at 9.   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  Diastolic dysfunction On Coozar, and toprol.  Cardiology will arrange  follow up out patient.  Elevated D dimer likely related to Covid. Patient denies chest pain.   HTN; started  metoprolol./  Resume lisinopril. HCTZ due to AKI.  continue with hydralazine to help with BP controlled.   Stable awaiting placement.      Discharge Diagnoses:  Principal Problem:   COVID-19 virus infection Active Problems:   Symptomatic anemia   Hyperglycemia due to diabetes mellitus (HCC)   Acute prerenal azotemia   Acute metabolic encephalopathy   Acute lower UTI   (HFpEF) heart failure with preserved  ejection fraction (HCC)   Generalized weakness   Cerebral thrombosis with cerebral infarction    Discharge Instructions  Discharge Instructions    Ambulatory referral to Neurology   Complete by: As directed    Follow up with stroke clinic NP (Jessica Vanschaick or Darrol Angel, if both not available, consider Manson Allan, or Ahern) at [facility] in about 4 weeks. Thanks.   Diet - low sodium heart healthy   Complete by: As directed    Increase activity slowly   Complete by: As directed      Allergies as of 03/03/2020   No Known Allergies     Medication List    STOP taking these medications   lisinopril-hydrochlorothiazide 20-12.5 MG tablet Commonly known as: ZESTORETIC     TAKE these medications   ascorbic acid 500 MG tablet Commonly known as: VITAMIN C Take 1 tablet (500 mg total) by mouth daily. Start taking on: March 04, 2020   aspirin 81 MG EC tablet Take 1 tablet (81 mg total) by mouth daily. Swallow whole. Start taking on: March 04, 2020   atorvastatin 40 MG tablet Commonly known as: LIPITOR Take 1 tablet (40 mg total) by mouth every evening.   clopidogrel 75 MG tablet Commonly known as: PLAVIX Take 1 tablet (75 mg total) by mouth daily for 21 days. Start taking on: March 04, 2020   cyanocobalamin 100 MCG tablet Take 1 tablet (100 mcg total) by mouth daily. Start taking on: March 04, 2020   folic acid 1 MG tablet Commonly known as: FOLVITE Take 1 tablet (1 mg total) by mouth daily. Start taking on: March 04, 2020   hydrALAZINE 25 MG tablet Commonly known as: APRESOLINE Take 1 tablet (25 mg total) by mouth 2 (two) times daily.   linagliptin 5 MG Tabs tablet Commonly known as: TRADJENTA Take 1 tablet (5 mg total) by mouth daily. Start taking on: March 04, 2020   losartan 25 MG tablet Commonly known as: COZAAR Take 1 tablet (25 mg total) by mouth daily. Start taking on: March 04, 2020   metoprolol succinate 25 MG 24 hr  tablet Commonly known as: TOPROL-XL Take 1 tablet (25 mg total) by mouth daily. Start taking on: March 04, 2020   multivitamin with minerals Tabs tablet Take 1 tablet by mouth daily. Start taking on: March 04, 2020   senna 8.6 MG Tabs tablet Commonly known as: SENOKOT Take 1 tablet (8.6 mg total) by mouth 2 (two) times daily.   zinc sulfate 220 (50 Zn) MG capsule Take 1 capsule (220 mg total) by mouth daily. Start taking on: March 04, 2020       Follow-up Information    [facility] Follow up on 03/24/2020.   Specialty: Internal Medicine Why: @2 :30.  Please take a list of medications and arrive 15 minutes early to complete paperwork. Contact information: [address] [place] [phone]       [phone], MD Follow up.   Specialty: Cardiology Why: New Patient Visit with Cardiology scheduled for 03/24/2020 at 9:40am. Please arrive 15 minutes  early for check-in. If this date/time does not work for you, please call our office to reschedule. Contact information: [address] [place] [address] [phone]        [facility]. Schedule an appointment as soon as possible for a visit in 4 week(s).   Specialty: Neurology Contact information: [address] [place] [address] [phone]             No Known Allergies  Consultations: Cardiology Neurology    Procedures/Studies: CT Code Stroke CTA Head W/WO contrast  Result Date: 02/26/2020 CLINICAL DATA:  Stroke. EXAM: CT ANGIOGRAPHY HEAD AND NECK TECHNIQUE: Multidetector CT imaging of the head and neck was performed using the standard protocol during bolus administration of intravenous contrast. Multiplanar CT image reconstructions and MIPs were obtained to evaluate the vascular anatomy. Carotid stenosis measurements (when applicable) are obtained utilizing NASCET  criteria, using the distal internal carotid diameter as the denominator. CONTRAST:  75mL OMNIPAQUE IOHEXOL 350 MG/ML SOLN COMPARISON:  Head MRI 02/25/2020 FINDINGS: CT HEAD FINDINGS Brain: The subcentimeter acute to early subacute right pontine infarct on MRI is not visible by CT. No acute cortically based infarct, intracranial hemorrhage, mass, midline shift, or extra-axial fluid collection is identified. Confluent hypodensities in the cerebral white matter bilaterally are nonspecific but compatible with severe chronic small vessel ischemic disease. There are chronic lacunar infarcts in the thalami. Vascular: Calcified atherosclerosis at the skull base. Skull: No fracture or suspicious osseous lesion. Sinuses: Mild scattered mucosal thickening in the paranasal sinuses. Clear mastoid air cells. Orbits: Unremarkable. Review of the MIP images confirms the above findings CTA NECK FINDINGS Aortic arch: Standard 3 vessel aortic arch with mild atherosclerotic plaque. No arch vessel origin stenosis. Right carotid system: Patent with mild calcified and soft plaque at the carotid bifurcation. No evidence of significant stenosis or dissection. Tortuous mid cervical ICA. Beading of the mid and distal cervical ICA. Left carotid system: Patent with mild calcified and soft plaque at the carotid bifurcation. No evidence of significant stenosis or dissection. Tortuous mid cervical ICA. Beading of the mid and distal cervical ICA. 4 mm laterally projecting outpouching from the ICA just below the skull base suggestive of a small pseudoaneurysm. Vertebral arteries: Patent without evidence of significant stenosis or dissection. Strongly dominant left vertebral artery. Skeleton: Advanced disc degeneration from C4-5 to C6-7. Other neck: No evidence of cervical lymphadenopathy or mass. Upper chest: Partially visualized small area of peripheral ground-glass opacity laterally in the right upper lobe. Review of the MIP images confirms the  above findings CTA HEAD FINDINGS Anterior circulation: The internal carotid arteries are patent from skull base to carotid termini with mild atherosclerotic plaque bilaterally not resulting in significant stenosis. There is a 2 mm aneurysm projecting inferiorly from the left supraclinoid ICA in the posterior communicating region. ACAs and MCAs are patent without evidence of a proximal branch occlusion. There is mild irregularity of the M1 segments and MCA branch vessels bilaterally without a flow limiting proximal stenosis, and there is moderate irregular narrowing of distal ACA branch vessels. Posterior circulation: The intracranial vertebral arteries are patent to the basilar with atherosclerotic plaque on the left resulting in luminal irregularity but no significant stenosis. The basilar artery is widely patent. Posterior communicating arteries are diminutive or absent. The PCAs are patent with mild distal branch vessel irregularity bilaterally as well as mild multifocal stenosis of the left P2 segment. No aneurysm is identified. Venous sinuses: As permitted by contrast timing,  patent. Anatomic variants: None. Review of the MIP images confirms the above findings IMPRESSION: 1. Intracranial atherosclerosis without a medium or large vessel occlusion or flow limiting proximal stenosis. 2. 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 4. Partially visualized small area of pulmonary ground-glass opacity laterally in the right upper lobe, nonspecific and incompletely evaluated though likely reflecting known COVID-19 infection. 5. Aortic Atherosclerosis (ICD10-I70.0). Electronically Signed   By: Sebastian Ache M.D.   On: 02/26/2020 21:10   CT Head Wo Contrast  Result Date: 02/23/2020 CLINICAL DATA:  Neck trauma. EXAM: CT HEAD WITHOUT CONTRAST CT CERVICAL SPINE WITHOUT  CONTRAST TECHNIQUE: Multidetector CT imaging of the head and cervical spine was performed following the standard protocol without intravenous contrast. Multiplanar CT image reconstructions of the cervical spine were also generated. COMPARISON:  None. FINDINGS: CT HEAD FINDINGS Brain: No evidence of acute infarction, hemorrhage, hydrocephalus, extra-axial collection or mass lesion/mass effect. There is chronic diffuse atrophy. Chronic bilateral periventricular white matter small vessel ischemic changes noted. Vascular: No hyperdense vessel is noted. Skull: Normal. Negative for fracture or focal lesion. Sinuses/Orbits: No acute finding. Other: None. CT CERVICAL SPINE FINDINGS Alignment: Normal. Skull base and vertebrae: No acute fracture. No primary bone lesion or focal pathologic process. Soft tissues and spinal canal: No prevertebral fluid or swelling. No visible canal hematoma. Disc levels: Degenerative joint changes with narrowed joint space and osteophyte formation identified mid to lower cervical spine. Upper chest: Negative. Other: None. IMPRESSION: 1. No focal acute intracranial abnormality identified. 2. Chronic diffuse atrophy and chronic bilateral periventricular white matter small vessel ischemic change. 3. No acute fracture or dislocation of cervical spine. 4. Degenerative joint changes of cervical spine. Electronically Signed   By: Sherian Rein M.D.   On: 02/23/2020 08:44   CT Code Stroke CTA Neck W/WO contrast  Result Date: 02/26/2020 CLINICAL DATA:  Stroke. EXAM: CT ANGIOGRAPHY HEAD AND NECK TECHNIQUE: Multidetector CT imaging of the head and neck was performed using the standard protocol during bolus administration of intravenous contrast. Multiplanar CT image reconstructions and MIPs were obtained to evaluate the vascular anatomy. Carotid stenosis measurements (when applicable) are obtained utilizing NASCET criteria, using the distal internal carotid diameter as the denominator. CONTRAST:  75mL  OMNIPAQUE IOHEXOL 350 MG/ML SOLN COMPARISON:  Head MRI 02/25/2020 FINDINGS: CT HEAD FINDINGS Brain: The subcentimeter acute to early subacute right pontine infarct on MRI is not visible by CT. No acute cortically based infarct, intracranial hemorrhage, mass, midline shift, or extra-axial fluid collection is identified. Confluent hypodensities in the cerebral white matter bilaterally are nonspecific but compatible with severe chronic small vessel ischemic disease. There are chronic lacunar infarcts in the thalami. Vascular: Calcified atherosclerosis at the skull base. Skull: No fracture or suspicious osseous lesion. Sinuses: Mild scattered mucosal thickening in the paranasal sinuses. Clear mastoid air cells. Orbits: Unremarkable. Review of the MIP images confirms the above findings CTA NECK FINDINGS Aortic arch: Standard 3 vessel aortic arch with mild atherosclerotic plaque. No arch vessel origin stenosis. Right carotid system: Patent with mild calcified and soft plaque at the carotid bifurcation. No evidence of significant stenosis or dissection. Tortuous mid cervical ICA. Beading of the mid and distal cervical ICA. Left carotid system: Patent with mild calcified and soft plaque at the carotid bifurcation. No evidence of significant stenosis or dissection. Tortuous mid cervical ICA. Beading of the mid and distal cervical ICA.  4 mm laterally projecting outpouching from the ICA just below the skull base suggestive of a small pseudoaneurysm. Vertebral arteries: Patent without evidence of significant stenosis or dissection. Strongly dominant left vertebral artery. Skeleton: Advanced disc degeneration from C4-5 to C6-7. Other neck: No evidence of cervical lymphadenopathy or mass. Upper chest: Partially visualized small area of peripheral ground-glass opacity laterally in the right upper lobe. Review of the MIP images confirms the above findings CTA HEAD FINDINGS Anterior circulation: The internal carotid arteries are  patent from skull base to carotid termini with mild atherosclerotic plaque bilaterally not resulting in significant stenosis. There is a 2 mm aneurysm projecting inferiorly from the left supraclinoid ICA in the posterior communicating region. ACAs and MCAs are patent without evidence of a proximal branch occlusion. There is mild irregularity of the M1 segments and MCA branch vessels bilaterally without a flow limiting proximal stenosis, and there is moderate irregular narrowing of distal ACA branch vessels. Posterior circulation: The intracranial vertebral arteries are patent to the basilar with atherosclerotic plaque on the left resulting in luminal irregularity but no significant stenosis. The basilar artery is widely patent. Posterior communicating arteries are diminutive or absent. The PCAs are patent with mild distal branch vessel irregularity bilaterally as well as mild multifocal stenosis of the left P2 segment. No aneurysm is identified. Venous sinuses: As permitted by contrast timing, patent. Anatomic variants: None. Review of the MIP images confirms the above findings IMPRESSION: 1. Intracranial atherosclerosis without a medium or large vessel occlusion or flow limiting proximal stenosis. 2. 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 4. Partially visualized small area of pulmonary ground-glass opacity laterally in the right upper lobe, nonspecific and incompletely evaluated though likely reflecting known COVID-19 infection. 5. Aortic Atherosclerosis (ICD10-I70.0). Electronically Signed   By: Sebastian Ache M.D.   On: 02/26/2020 21:10   CT Cervical Spine Wo Contrast  Result Date: 02/23/2020 CLINICAL DATA:  Neck trauma. EXAM: CT HEAD WITHOUT CONTRAST CT CERVICAL SPINE WITHOUT CONTRAST TECHNIQUE: Multidetector CT imaging of the head and cervical spine was  performed following the standard protocol without intravenous contrast. Multiplanar CT image reconstructions of the cervical spine were also generated. COMPARISON:  None. FINDINGS: CT HEAD FINDINGS Brain: No evidence of acute infarction, hemorrhage, hydrocephalus, extra-axial collection or mass lesion/mass effect. There is chronic diffuse atrophy. Chronic bilateral periventricular white matter small vessel ischemic changes noted. Vascular: No hyperdense vessel is noted. Skull: Normal. Negative for fracture or focal lesion. Sinuses/Orbits: No acute finding. Other: None. CT CERVICAL SPINE FINDINGS Alignment: Normal. Skull base and vertebrae: No acute fracture. No primary bone lesion or focal pathologic process. Soft tissues and spinal canal: No prevertebral fluid or swelling. No visible canal hematoma. Disc levels: Degenerative joint changes with narrowed joint space and osteophyte formation identified mid to lower cervical spine. Upper chest: Negative. Other: None. IMPRESSION: 1. No focal acute intracranial abnormality identified. 2. Chronic diffuse atrophy and chronic bilateral periventricular white matter small vessel ischemic change. 3. No acute fracture or dislocation of cervical spine. 4. Degenerative joint changes of cervical spine. Electronically Signed   By: Sherian Rein M.D.   On: 02/23/2020 08:44   MR BRAIN WO CONTRAST  Result Date: 02/26/2020 CLINICAL DATA:  Initial evaluation for acute delirium, found down. EXAM: MRI HEAD WITHOUT CONTRAST TECHNIQUE: Multiplanar, multiecho pulse sequences of the brain and surrounding structures were obtained without intravenous contrast. COMPARISON:  Prior CT from 02/23/2020. FINDINGS: Brain: Diffuse prominence of the CSF containing spaces compatible with generalized age-related cerebral atrophy. Extensive patchy and confluent T2/FLAIR hyperintensity throughout the periventricular and deep white matter both cerebral hemispheres as well as the pons total most  consistent with chronic microvascular ischemic disease, advanced in nature. Multiple superimposed remote lacunar infarcts present about the periventricular white matter of the corona radiata as well as the deep gray nuclei. 7 mm focus of mild diffusion abnormality seen involving the right paramedian pons, consistent with an acute to subacute small vessel type infarct (series 5, image 61). No associated hemorrhage or mass effect. No other foci of diffusion abnormality to suggest acute or subacute ischemia. Gray-white matter differentiation otherwise maintained. No encephalomalacia to suggest chronic cortical infarction elsewhere within the brain. No other foci of susceptibility artifact to suggest acute or chronic intracranial hemorrhage. No mass lesion, midline shift or mass effect. Mild diffuse ventricular prominence related to global parenchymal volume loss without hydrocephalus. No made of a partially empty sella. No extra-axial fluid collection. Midline structures intact. Vascular: Major intracranial vascular flow voids are maintained. Strongly dominant left vertebral artery noted. Skull and upper cervical spine: Craniocervical junction within normal limits. Upper cervical spine demonstrates no acute finding. Bone marrow signal intensity within normal limits. Scalp soft tissues unremarkable. Sinuses/Orbits: Globes and orbital soft tissues within normal limits. Mild-to-moderate mucosal thickening noted throughout the paranasal sinuses, likely allergic/inflammatory nature. Mastoid air cells are largely clear. Other: None. IMPRESSION: 1. 7 mm focus of diffusion abnormality involving the right paramedian pons, consistent with an acute to early subacute small vessel type infarct. No associated hemorrhage or mass effect. 2. No other acute intracranial abnormality. 3. Age-related cerebral atrophy with advanced chronic microvascular ischemic disease, with multiple superimposed remote lacunar infarcts involving the  hemispheric cerebral white matter and deep gray nuclei. Electronically Signed   By: Rise MuBenjamin  McClintock M.D.   On: 02/26/2020 00:24   DG Chest Port 1 View  Result Date: 02/24/2020 CLINICAL DATA:  COVID-19 positive.  Weakness. EXAM: PORTABLE CHEST 1 VIEW COMPARISON:  None. FINDINGS: There is a subtle area of airspace opacity in the lateral right base region. Lungs elsewhere clear. Heart is borderline enlarged with pulmonary vascularity normal. No adenopathy. There is aortic atherosclerosis. No bone lesions. IMPRESSION: Subtle airspace opacity lateral right base concerning for focus of developing pneumonia. Atypical organism pneumonia could present in this manner. Lungs otherwise clear. Heart borderline enlarged. Aortic Atherosclerosis (ICD10-I70.0). Electronically Signed   By: Bretta BangWilliam  Woodruff III M.D.   On: 02/24/2020 10:31   ECHOCARDIOGRAM COMPLETE  Result Date: 02/24/2020    ECHOCARDIOGRAM REPORT   Patient Name:   Satira MccallumJUNE Wilmeth Date of Exam: 02/24/2020 Medical Rec #:  [id number]   Height:       66.0 in Accession #:    [id number]  Weight:       180.0 lb Date of Birth:  11/26/1950    BSA:          1.912 m Patient Age:    69 years    BP:           122/62 mmHg Patient Gender: F           HR:           85 bpm. Exam Location:  Inpatient Procedure: 2D Echo, 3D Echo, Cardiac Doppler and Color Doppler Indications:    I50.9* Heart failure (unspecified)  History:        Patient has prior history of Echocardiogram examinations, most  recent 11/12/2018. Stroke, Signs/Symptoms:Syncope; Risk                 Factors:Diabetes. Covid positive.  Sonographer:    Sheralyn Boatman RDCS Referring Phys: 49 MICHAEL E NORINS  Sonographer Comments: Technically difficult study due to poor echo windows. IMPRESSIONS  1. Left ventricular ejection fraction, by estimation, is 45 to 50%. The left ventricle has mildly decreased function. The left ventricle demonstrates global hypokinesis. There is moderate concentric left ventricular  hypertrophy. Left ventricular diastolic parameters are consistent with Grade I diastolic dysfunction (impaired relaxation).  2. Right ventricular systolic function is normal. The right ventricular size is normal. There is normal pulmonary artery systolic pressure.  3. The mitral valve is degenerative. Mild mitral valve regurgitation.  4. The aortic valve is tricuspid. Aortic valve regurgitation is mild.  5. Aortic dilatation noted. There is mild dilatation of the ascending aorta, measuring 38 mm.  6. The inferior vena cava is normal in size with greater than 50% respiratory variability, suggesting right atrial pressure of 3 mmHg. Comparison(s): Compared to prior echo report (images would not load) in 2016, the LVEF is now mildly reduced at 45-50%. FINDINGS  Left Ventricle: Left ventricular ejection fraction, by estimation, is 45 to 50%. The left ventricle has mildly decreased function. The left ventricle demonstrates global hypokinesis. The left ventricular internal cavity size was normal in size. There is  moderate concentric left ventricular hypertrophy. Left ventricular diastolic parameters are consistent with Grade I diastolic dysfunction (impaired relaxation). Right Ventricle: The right ventricular size is normal. No increase in right ventricular wall thickness. Right ventricular systolic function is normal. There is normal pulmonary artery systolic pressure. The tricuspid regurgitant velocity is 2.57 m/s, and  with an assumed right atrial pressure of 8 mmHg, the estimated right ventricular systolic pressure is 34.4 mmHg. Left Atrium: Left atrial size was normal in size. Right Atrium: Right atrial size was normal in size. Pericardium: There is no evidence of pericardial effusion. Mitral Valve: The mitral valve is degenerative in appearance. There is mild thickening of the mitral valve leaflet(s). There is mild calcification of the mitral valve leaflet(s). Mild mitral annular calcification. Mild mitral valve  regurgitation. Tricuspid Valve: The tricuspid valve is normal in structure. Tricuspid valve regurgitation is mild. Aortic Valve: The aortic valve is tricuspid. Aortic valve regurgitation is mild. Aortic regurgitation PHT measures 696 msec. Pulmonic Valve: The pulmonic valve was normal in structure. Pulmonic valve regurgitation is mild. Aorta: Aortic dilatation noted. There is mild dilatation of the ascending aorta, measuring 38 mm. Venous: The inferior vena cava is normal in size with greater than 50% respiratory variability, suggesting right atrial pressure of 3 mmHg. IAS/Shunts: No atrial level shunt detected by color flow Doppler.  LEFT VENTRICLE PLAX 2D LVIDd:         3.95 cm     Diastology LVIDs:         3.35 cm     LV e' medial:    3.81 cm/s LV PW:         2.15 cm     LV E/e' medial:  11.7 LV IVS:        1.50 cm     LV e' lateral:   6.36 cm/s LVOT diam:     2.30 cm     LV E/e' lateral: 7.0 LV SV:         106 LV SV Index:   55 LVOT Area:     4.15 cm  LV Volumes (MOD) LV  vol d, MOD A2C: 58.5 ml LV vol d, MOD A4C: 78.2 ml LV vol s, MOD A2C: 34.5 ml LV vol s, MOD A4C: 50.9 ml LV SV MOD A2C:     24.0 ml LV SV MOD A4C:     78.2 ml LV SV MOD BP:      26.6 ml IVC IVC diam: 1.50 cm LEFT ATRIUM             Index       RIGHT ATRIUM           Index LA diam:        1.80 cm 0.94 cm/m  RA Area:     12.90 cm LA Vol (A2C):   66.4 ml 34.72 ml/m RA Volume:   27.30 ml  14.28 ml/m LA Vol (A4C):   53.1 ml 27.77 ml/m LA Biplane Vol: 59.9 ml 31.32 ml/m  AORTIC VALVE             PULMONIC VALVE LVOT Vmax:   123.00 cm/s PR End Diast Vel: 1.50 msec LVOT Vmean:  82.500 cm/s LVOT VTI:    0.254 m AI PHT:      696 msec  AORTA Ao Root diam: 4.15 cm Ao Asc diam:  3.80 cm MITRAL VALVE                TRICUSPID VALVE MV Area (PHT): 4.89 cm     TR Peak grad:   26.4 mmHg MV Decel Time: 155 msec     TR Vmax:        257.00 cm/s MV E velocity: 44.70 cm/s MV A velocity: 108.00 cm/s  SHUNTS MV E/A ratio:  0.41         Systemic VTI:  0.25 m                              Systemic Diam: 2.30 cm Laurance Flatten MD Electronically signed by Laurance Flatten MD Signature Date/Time: 02/24/2020/4:23:19 PM    Final       Subjective: She is alert and conversant. No new complaint  Discharge Exam: Vitals:   03/03/20 0424 03/03/20 1208  BP: 123/81 115/68  Pulse: 82 76  Resp: 18 16  Temp: 98.1 F (36.7 C) 98 F (36.7 C)  SpO2: 100% 100%     General: Pt is alert, awake, not in acute distress Cardiovascular: RRR, S1/S2 +, no rubs, no gallops Respiratory: CTA bilaterally, no wheezing, no rhonchi Abdominal: Soft, NT, ND, bowel sounds + Extremities: no edema, no cyanosis    The results of significant diagnostics from this hospitalization (including imaging, microbiology, ancillary and laboratory) are listed below for reference.     Microbiology: Recent Results (from the past 240 hour(s))  SARS CORONAVIRUS 2 (TAT 6-24 HRS) Nasopharyngeal Nasopharyngeal Swab     Status: Abnormal   Collection Time: 02/23/20 11:48 AM   Specimen: Nasopharyngeal Swab  Result Value Ref Range Status   SARS Coronavirus 2 POSITIVE (A) NEGATIVE Final    Comment: (NOTE) SARS-CoV-2 target nucleic acids are DETECTED.  The SARS-CoV-2 RNA is generally detectable in upper and lower respiratory specimens during the acute phase of infection. Positive results are indicative of the presence of SARS-CoV-2 RNA. Clinical correlation with patient history and other diagnostic information is  necessary to determine patient infection status. Positive results do not rule out bacterial infection or co-infection with other viruses.  The expected result is Negative.  Fact Sheet for Patients:  HairSlick.no  Fact Sheet for Healthcare Providers: quierodirigir.com  This test is not yet approved or cleared by the [place] FDA and  has been authorized for detection and/or diagnosis of SARS-CoV-2 by FDA under an  Emergency Use Authorization (EUA). This EUA will remain  in effect (meaning this test can be used) for the duration of the COVID-19 declaration under Section 564(b)(1) of the Act, 21 U. S.C. section 360bbb-3(b)(1), unless the authorization is terminated or revoked sooner.   Performed at [facility], [address] [place], [place] [address]   Urine culture     Status: None   Collection Time: 02/23/20  3:10 PM   Specimen: Urine, Catheterized  Result Value Ref Range Status   Specimen Description URINE, CATHETERIZED  Final   Special Requests NONE  Final   Culture   Final    NO GROWTH Performed at [facility], [address] [place], [place] [address]    Report Status 02/24/2020 FINAL  Final     Labs: BNP (last 3 results) Recent Labs    02/23/20 1510  BNP 36.3   Basic Metabolic Panel: Recent Labs  Lab 02/26/20 0412 02/27/20 0500 02/28/20 0446  NA 137 136 136  K 4.1 3.8 3.9  CL 104 105 104  CO2 24 22 22   GLUCOSE 91 160* 149*  BUN 18 22 18   CREATININE 0.89 1.05* 0.89  CALCIUM 8.5* 8.2* 8.3*  MG 2.0 2.0 2.0  PHOS 4.2 4.2 4.2   Liver Function Tests: Recent Labs  Lab 02/26/20 0412 02/27/20 0500 02/28/20 0446  AST 14* 13* 13*  ALT 11 10 10   ALKPHOS 64 62 65  BILITOT 0.5 0.3 0.4  PROT 6.7 6.5 6.4*  ALBUMIN 2.2* 2.2* 2.2*   No results for input(s): LIPASE, AMYLASE in the last 168 hours. No results for input(s): AMMONIA in the last 168 hours. CBC: Recent Labs  Lab 02/26/20 0412 02/27/20 0500 02/28/20 0446 02/29/20 0241 03/01/20 0012 03/02/20 0901  WBC 6.9 7.1 5.8 6.4  --   --   NEUTROABS 5.4 5.0 3.8 4.5  --   --   HGB 7.2* 7.3* 7.0* 6.6* 7.9* 9.6*  HCT 22.5* 24.1* 21.2* 20.6* 23.7* 29.9*  MCV 71.4* 73.0* 70.9* 70.1*  --   --   PLT 490* 612* 546* 627*  --   --    Cardiac Enzymes: No results for input(s): CKTOTAL, CKMB, CKMBINDEX, TROPONINI in the last 168 hours. BNP: Invalid input(s): POCBNP CBG: Recent Labs  Lab 03/02/20 1152  03/02/20 1633 03/02/20 2112 03/03/20 0755 03/03/20 1113  GLUCAP 144* 150* 169* 131* 130*   D-Dimer No results for input(s): DDIMER in the last 72 hours. Hgb A1c No results for input(s): HGBA1C in the last 72 hours. Lipid Profile No results for input(s): CHOL, HDL, LDLCALC, TRIG, CHOLHDL, LDLDIRECT in the last 72 hours. Thyroid function studies No results for input(s): TSH, T4TOTAL, T3FREE, THYROIDAB in the last 72 hours.  Invalid input(s): FREET3 Anemia work up No results for input(s): VITAMINB12, FOLATE, FERRITIN, TIBC, IRON, RETICCTPCT in the last 72 hours. Urinalysis    Component Value Date/Time   COLORURINE YELLOW 02/23/2020 0955   APPEARANCEUR HAZY (A) 02/23/2020 0955   LABSPEC 1.020 02/23/2020 0955   PHURINE 5.0 02/23/2020 0955   GLUCOSEU >=500 (A) 02/23/2020 0955   HGBUR NEGATIVE 02/23/2020 0955   BILIRUBINUR NEGATIVE 02/23/2020 0955   KETONESUR NEGATIVE 02/23/2020 0955   PROTEINUR 100 (A) 02/23/2020 0955   NITRITE NEGATIVE 02/23/2020 0955  LEUKOCYTESUR SMALL (A) 02/23/2020 0955   Sepsis Labs Invalid input(s): PROCALCITONIN,  WBC,  LACTICIDVEN Microbiology Recent Results (from the past 240 hour(s))  SARS CORONAVIRUS 2 (TAT 6-24 HRS) Nasopharyngeal Nasopharyngeal Swab     Status: Abnormal   Collection Time: 02/23/20 11:48 AM   Specimen: Nasopharyngeal Swab  Result Value Ref Range Status   SARS Coronavirus 2 POSITIVE (A) NEGATIVE Final    Comment: (NOTE) SARS-CoV-2 target nucleic acids are DETECTED.  The SARS-CoV-2 RNA is generally detectable in upper and lower respiratory specimens during the acute phase of infection. Positive results are indicative of the presence of SARS-CoV-2 RNA. Clinical correlation with patient history and other diagnostic information is  necessary to determine patient infection status. Positive results do not rule out bacterial infection or co-infection with other viruses.  The expected result is Negative.  Fact Sheet for  Patients: HairSlick.no  Fact Sheet for Healthcare Providers: quierodirigir.com  This test is not yet approved or cleared by the [place] FDA and  has been authorized for detection and/or diagnosis of SARS-CoV-2 by FDA under an Emergency Use Authorization (EUA). This EUA will remain  in effect (meaning this test can be used) for the duration of the COVID-19 declaration under Section 564(b)(1) of the Act, 21 U. S.C. section 360bbb-3(b)(1), unless the authorization is terminated or revoked sooner.   Performed at [facility], [address], [place], [place] [address]   Urine culture     Status: None   Collection Time: 02/23/20  3:10 PM   Specimen: Urine, Catheterized  Result Value Ref Range Status   Specimen Description URINE, CATHETERIZED  Final   Special Requests NONE  Final   Culture   Final    NO GROWTH Performed at [facility], [address], [place], [place] [address]    Report Status 02/24/2020 FINAL  Final     Time coordinating discharge: 40 minutes  SIGNED:   Alba Cory, MD  [facility]

## 2020-03-03 NOTE — Progress Notes (Addendum)
Patients blood glucose 63. Patient declines juice or snack to raise glucose, however patient is currently eating a meal tray. Patient endorses no symptoms of hypoglycemia, will recheck blood sugar after patient has finished meal.   1651: recheck 74. Patient still eating meal at this time

## 2020-03-04 DIAGNOSIS — N390 Urinary tract infection, site not specified: Secondary | ICD-10-CM

## 2020-03-04 DIAGNOSIS — I5032 Chronic diastolic (congestive) heart failure: Secondary | ICD-10-CM

## 2020-03-04 LAB — TYPE AND SCREEN
ABO/RH(D): O POS
Antibody Screen: POSITIVE
DAT, IgG: NEGATIVE
Unit division: 0
Unit division: 0

## 2020-03-04 LAB — BPAM RBC
Blood Product Expiration Date: 202202252359
Blood Product Expiration Date: 202202262359
ISSUE DATE / TIME: 202201291712
Unit Type and Rh: 5100
Unit Type and Rh: 5100

## 2020-03-04 LAB — GLUCOSE, CAPILLARY
Glucose-Capillary: 125 mg/dL — ABNORMAL HIGH (ref 70–99)
Glucose-Capillary: 165 mg/dL — ABNORMAL HIGH (ref 70–99)
Glucose-Capillary: 126 mg/dL — ABNORMAL HIGH (ref 70–99)
Glucose-Capillary: 184 mg/dL — ABNORMAL HIGH (ref 70–99)

## 2020-03-04 NOTE — Progress Notes (Signed)
PROGRESS NOTE  Ruth Reynolds ZOX:[id number] DOB: 1950-11-16 DOA: 02/23/2020 PCP: Patient, No Pcp Per   LOS: 9 days   Brief Narrative / Interim history: 70 year old female with DM 2, chronic anemia, prior SDH, chronic diastolic CHF came into the hospital via EMS after being found on the floor.  Patient had a fall the day prior to admission has been on the floor for roughly 24 hours.  Currently lives with her niece.  There are reports of generalized weakness and increased fatigue.  She was found to be Covid positive, had no significant respiratory symptoms, but presented with metabolic encephalopathy.  She was also found to have a stroke and new cardiomyopathy, and placed on aspirin and Plavix for 3 weeks then aspirin alone.  Subjective / 24h Interval events: No complaints this morning  Assessment & Plan: Principal Problem COVID-19 infection-due to high risk, she received 3 days of Remdesivir.  She was positive for Covid on 1/23, will be off quarantine on 2/3  Active Problems Urinary tract infection-status post ciprofloxacin.  Urine cultures are negative Acute/subacute stroke, acute metabolic encephalopathy-MRI showed stroke, 7 mm focus diffusion abnormality in the right paramedial pons consistent with acute to subacute infarct.  Multiple remote lacunar infarcts.  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  Neurology consulted, currently on aspirin and Plavix for 3 weeks and aspirin alone.  She is on a statin.  PT recommended SNF and placement is pending Iron deficiency anemia-transfuse unit of packed red blood cells, hemoglobin repeat is stable.  Continue iron Chronic systolic CHF-echo showed an EF of 45-50%, continue beta-blockers, ARB Hypertension -continue medications as below  Scheduled Meds: . sodium chloride   Intravenous Once  . vitamin C  500 mg Oral Daily  . aspirin EC  81  mg Oral Daily  . atorvastatin  40 mg Oral QPM  . clopidogrel  75 mg Oral Daily  . folic acid  1 mg Oral Daily  . hydrALAZINE  25 mg Oral BID  . insulin aspart  0-15 Units Subcutaneous TID WC  . linagliptin  5 mg Oral Daily  . losartan  25 mg Oral Daily  . metoprolol succinate  25 mg Oral Daily  . multivitamin with minerals  1 tablet Oral Daily  . senna  1 tablet Oral BID  . vitamin B-12  100 mcg Oral Daily  . zinc sulfate  220 mg Oral Daily   Continuous Infusions: PRN Meds:.acetaminophen, chlorpheniramine-HYDROcodone, guaiFENesin-dextromethorphan, Ipratropium-Albuterol, traMADol  Diet Orders (From admission, onward)    Start     Ordered   03/03/20 0000  Diet - low sodium heart healthy        03/03/20 1611   02/25/20 1727  Diet Carb Modified Fluid consistency: Thin; Room service appropriate? Yes  Diet effective now       Comments: Patient doesn't eat Pork.  Question Answer Comment  Diet-HS Snack? Nothing   Calorie Level Medium 1600-2000   Fluid consistency: Thin   Room service appropriate? Yes      02/25/20 1726          DVT prophylaxis:      Code Status: Full Code  Family Communication: no family at bedside   Status is: Inpatient  Remains inpatient appropriate because:Unsafe d/c plan   Dispo: The patient is from: Home              Anticipated  d/c is to: SNF              Anticipated d/c date is: 2 days              Patient currently is medically stable to d/c.   Difficult to place patient Yes  Level of care: Med-Surg  Consultants:  none  Procedures:  None   Microbiology  None   Antimicrobials: None     Objective: Vitals:   03/03/20 0424 03/03/20 1208 03/03/20 2027 03/04/20 0525  BP: 123/81 115/68 123/77 123/70  Pulse: 82 76 100 86  Resp: 18 16 18 20   Temp: 98.1 F (36.7 C) 98 F (36.7 C) 98 F (36.7 C) 98 F (36.7 C)  TempSrc: Oral Oral    SpO2: 100% 100% 99% 100%  Weight:      Height:       No intake or output data in the 24 hours  ending 03/04/20 1249 Filed Weights   02/26/20 0800  Weight: 81.6 kg    Examination:  Constitutional: NAD Eyes: no scleral icterus ENMT: Mucous membranes are moist.  Neck: normal, supple Respiratory: clear to auscultation bilaterally, no wheezing, no crackles. Normal respiratory effort. Cardiovascular: Regular rate and rhythm, no murmurs / rubs / gallops. No LE edema. Good peripheral pulses Abdomen: non distended, no tenderness. Bowel sounds positive.  Musculoskeletal: no clubbing / cyanosis.  Skin: no rashes Neurologic: non focal    Data Reviewed: I have independently reviewed following labs and imaging studies   CBC: Recent Labs  Lab 02/27/20 0500 02/28/20 0446 02/29/20 0241 03/01/20 0012 03/02/20 0901  WBC 7.1 5.8 6.4  --   --   NEUTROABS 5.0 3.8 4.5  --   --   HGB 7.3* 7.0* 6.6* 7.9* 9.6*  HCT 24.1* 21.2* 20.6* 23.7* 29.9*  MCV 73.0* 70.9* 70.1*  --   --   PLT 612* 546* 627*  --   --    Basic Metabolic Panel: Recent Labs  Lab 02/27/20 0500 02/28/20 0446  NA 136 136  K 3.8 3.9  CL 105 104  CO2 22 22  GLUCOSE 160* 149*  BUN 22 18  CREATININE 1.05* 0.89  CALCIUM 8.2* 8.3*  MG 2.0 2.0  PHOS 4.2 4.2   Liver Function Tests: Recent Labs  Lab 02/27/20 0500 02/28/20 0446  AST 13* 13*  ALT 10 10  ALKPHOS 62 65  BILITOT 0.3 0.4  PROT 6.5 6.4*  ALBUMIN 2.2* 2.2*   Coagulation Profile: No results for input(s): INR, PROTIME in the last 168 hours. HbA1C: No results for input(s): HGBA1C in the last 72 hours. CBG: Recent Labs  Lab 03/03/20 1631 03/03/20 1651 03/03/20 2028 03/04/20 0813 03/04/20 1154  GLUCAP 63* 74 250* 125* 165*    Recent Results (from the past 240 hour(s))  Urine culture     Status: None   Collection Time: 02/23/20  3:10 PM   Specimen: Urine, Catheterized  Result Value Ref Range Status   Specimen Description URINE, CATHETERIZED  Final   Special Requests NONE  Final   Culture   Final    NO GROWTH Performed at [facility], [address] [place], [place]    Report Status 02/24/2020 FINAL  Final     Radiology Studies: No results found.   02/26/2020, MD, PhD [facility]  Between 7 am - 7 pm I am available, please contact me via Amion or Securechat  Between 7 pm - 7 am I am  not available, please contact night coverage MD/APP via Amion

## 2020-03-04 NOTE — TOC Progression Note (Addendum)
Transition of Care [facility]) - Progression Note    Patient Details  Name: Ruth Reynolds MRN: [id number] Date of Birth: 1950/02/01  Transition of Care [facility]) CM/SW Contact  Beckie Busing, RN Phone Number: [phone]  03/04/2020, 9:04 AM  Clinical Narrative:    CM called [name] to follow up on open covid beds for today. Per Geni Bers she has to review her boards to make syre she has room for the patient and will call CM back. CM has resubmitted updated clinicals for insurance auth.   0093 Patient has been updated on bed availability and is agreeable. Niece Bernadene Person (niece) has been updated per patients request.   1000 CM attempting to update insurance Auth by phone and is made aware by [facility] that patients plan termed on 03/02/2020. Patient has no active policy. CM contacted niece Bernadene Person) who is unaware of the patients insurance coverage. Patient does not answer phone at this time.  Patient has secondary of medicaid listed. Message has been sent to [facility] at [place] [facility]. Awaiting return call.   1015 CM spoke with Lasha at [facility] to make her aware of the patients insurance policy with [facility] is not active. Geni Bers states that she will need to speak with the administrator to determine if the patient will be able to be admitted to the facility.   1115 [name] is unable to accept patient due to no insurance verification. CM spoke with patient to inquire about insurance coverage and patient states that she is unsure about her insurance coverage and is unable to provide any information about coverage. Patient rambles as CM is asking specific questions about insurance. Patient states that she will have to call but does not give CM any info on who she will call for insurance verification.   1320 Message has been sent to financial counselor for assistance in determining if patient has insurance.  Expected Discharge Plan: Skilled Nursing Facility Barriers to Discharge: Continued Medical Work up  Expected Discharge Plan  and Services Expected Discharge Plan: Skilled Nursing Facility In-house Referral: NA Discharge Planning Services: CM Consult   Living arrangements for the past 2 months: Single Family Home                 DME Arranged: N/A DME Agency: NA                   Social Determinants of Health (SDOH) Interventions    Readmission Risk Interventions No flowsheet data found.

## 2020-03-05 LAB — GLUCOSE, CAPILLARY
Glucose-Capillary: 108 mg/dL — ABNORMAL HIGH (ref 70–99)
Glucose-Capillary: 183 mg/dL — ABNORMAL HIGH (ref 70–99)
Glucose-Capillary: 173 mg/dL — ABNORMAL HIGH (ref 70–99)
Glucose-Capillary: 175 mg/dL — ABNORMAL HIGH (ref 70–99)

## 2020-03-05 LAB — BASIC METABOLIC PANEL
Anion gap: 11 (ref 5–15)
BUN: 25 mg/dL — ABNORMAL HIGH (ref 8–23)
CO2: 21 mmol/L — ABNORMAL LOW (ref 22–32)
Calcium: 8.5 mg/dL — ABNORMAL LOW (ref 8.9–10.3)
Chloride: 106 mmol/L (ref 98–111)
Creatinine, Ser: 1.17 mg/dL — ABNORMAL HIGH (ref 0.44–1.00)
GFR, Estimated: 51 mL/min — ABNORMAL LOW (ref >60)
Glucose, Bld: 124 mg/dL — ABNORMAL HIGH (ref 70–99)
Potassium: 4.3 mmol/L (ref 3.5–5.1)
Sodium: 138 mmol/L (ref 135–145)

## 2020-03-05 LAB — CBC
HCT: 25.9 % — ABNORMAL LOW (ref 36.0–46.0)
Hemoglobin: 8 g/dL — ABNORMAL LOW (ref 12.0–15.0)
MCH: 23.6 pg — ABNORMAL LOW (ref 26.0–34.0)
MCHC: 30.9 g/dL (ref 30.0–36.0)
MCV: 76.4 fL — ABNORMAL LOW (ref 80.0–100.0)
Platelets: 539 10*3/uL — ABNORMAL HIGH (ref 150–400)
RBC: 3.39 MIL/uL — ABNORMAL LOW (ref 3.87–5.11)
RDW: 22.3 % — ABNORMAL HIGH (ref 11.5–15.5)
WBC: 8.5 10*3/uL (ref 4.0–10.5)
nRBC: 0 % (ref 0.0–0.2)

## 2020-03-05 MED ORDER — FUROSEMIDE 10 MG/ML IJ SOLN
20.0000 mg | Freq: Once | INTRAMUSCULAR | Status: AC
  Administered 2020-03-05: 20 mg via INTRAVENOUS
  Filled 2020-03-05: qty 2

## 2020-03-05 NOTE — Progress Notes (Signed)
Occupational Therapy Treatment Patient Details Name: Ruthellen Yerkes MRN: [id number] DOB: 1950-12-22 Today's Date: 03/05/2020    History of present illness Pt is a 70 y/o female admitted after being on floor for a day, pt states she fell then slept there and refused to get up. Found to be COVID + with encephalopathy. PMH includes DM, anemia, CHF, and SDH.   OT comments  Pt appears to be progressing overall towards acute OT goals. Walked 100' 2 days ago during PT session. Limited session today as pt reports not feeling well and declined EOB/OOB activity. Encouraged bed level general UB/LB exercises at bed level when she's feeling better. D/c plan remains appropriate.   Follow Up Recommendations  SNF    Equipment Recommendations  Other (comment) (TBD next venue)    Recommendations for Other Services      Precautions / Restrictions Precautions Precautions: Fall Restrictions Weight Bearing Restrictions: No       Mobility Bed Mobility Overal bed mobility: Needs Assistance             General bed mobility comments: able to bring herself into longsitting and reach to access R foot. Pt declined further bed mobility.  Transfers                 General transfer comment: pt declined 2/2 feeling poorly today.    Balance Overall balance assessment: Needs assistance   Sitting balance-Leahy Scale: Good Sitting balance - Comments: drinking while in long sitting.                                   ADL either performed or assessed with clinical judgement   ADL Overall ADL's : Needs assistance/impaired Eating/Feeding: Set up;Sitting Eating/Feeding Details (indicate cue type and reason): able to drink from cup while long sitting in the bed                                   General ADL Comments: Pt declined OOB activity but agreeable to some bed level assessment. Nursing entered room during session and reports pt has feeling poorly today.      Vision       Perception     Praxis      Cognition Arousal/Alertness: Awake/alert Behavior During Therapy: Flat affect;WFL for tasks assessed/performed Overall Cognitive Status: No family/caregiver present to determine baseline cognitive functioning                                 General Comments: short, terse responses. Internally distracted. Pt reports she hasn't "slept in 3 days."        Exercises Other Exercises Other Exercises: encouraged pt to work on AROM BUE/BLE while in bed when she feels up to it.   Shoulder Instructions       General Comments      Pertinent Vitals/ Pain       Pain Assessment: Faces Faces Pain Scale: Hurts a little bit Pain Location: unspecified, discomfort Pain Descriptors / Indicators: Discomfort Pain Intervention(s): Monitored during session  Home Living                                          Prior  Functioning/Environment              Frequency  Min 2X/week        Progress Toward Goals  OT Goals(current goals can now be found in the care plan section)  Progress towards OT goals: Progressing toward goals (overall, limited sessiontoday though)  Acute Rehab OT Goals Patient Stated Goal: none stated OT Goal Formulation: With patient Time For Goal Achievement: 03/19/20 Potential to Achieve Goals: Good ADL Goals Pt Will Perform Grooming: with min guard assist;standing Pt Will Perform Lower Body Dressing: with min assist;with min guard assist;sit to/from stand Pt Will Transfer to Toilet: with min guard assist;ambulating;bedside commode Pt Will Perform Toileting - Clothing Manipulation and hygiene: with min guard assist;sitting/lateral leans;sit to/from stand Additional ADL Goal #1: Pt will demonstrate sustained attention during ADL with no cues Additional ADL Goal #2: Pt will follow two step commands during ADLs with Min cues  Plan Discharge plan remains appropriate     Co-evaluation                 AM-PAC OT "6 Clicks" Daily Activity     Outcome Measure   Help from another person eating meals?: None Help from another person taking care of personal grooming?: A Little Help from another person toileting, which includes using toliet, bedpan, or urinal?: A Lot Help from another person bathing (including washing, rinsing, drying)?: A Lot Help from another person to put on and taking off regular upper body clothing?: A Little Help from another person to put on and taking off regular lower body clothing?: A Lot 6 Click Score: 16    End of Session    OT Visit Diagnosis: Unsteadiness on feet (R26.81);Other abnormalities of gait and mobility (R26.89);Muscle weakness (generalized) (M62.81)   Activity Tolerance Patient limited by fatigue;Other (comment) (pt reports not feeling well today, nursing r/o same.)   Patient Left in bed;with call bell/phone within reach   Nurse Communication Other (comment) (Nurse entered room during session and r/o pt not feeling well today.)        Time: 0814-4818 OT Time Calculation (min): 8 min  Charges: OT General Charges $OT Visit: 1 Visit OT Treatments $Self Care/Home Management : 8-22 mins  Raynald Kemp, OT Acute Rehabilitation Services Pager: [phone] Office: [phone]    Pilar Grammes 03/05/2020, 1:34 PM

## 2020-03-05 NOTE — Progress Notes (Signed)
PROGRESS NOTE  Ruth Reynolds [id number]:[id number] DOB: [birth year] DOA: 02/23/2020 PCP: Patient, No Pcp Per   LOS: 10 days   Brief Narrative / Interim history: 70 year old female with DM 2, chronic anemia, prior SDH, chronic diastolic CHF came into the hospital via EMS after being found on the floor.  Patient had a fall the day prior to admission has been on the floor for roughly 24 hours.  Currently lives with her niece.  There are reports of generalized weakness and increased fatigue.  She was found to be Covid positive, had no significant respiratory symptoms, but presented with metabolic encephalopathy.  She was also found to have a stroke and new cardiomyopathy, and placed on aspirin and Plavix for 3 weeks then aspirin alone.  Subjective / 24h Interval events: No complaints  Assessment & Plan: Principal Problem COVID-19 infection-due to high risk, she received 3 days of Remdesivir.  She was positive for Covid on 1/23, take off quarantine for 10 days, today 2/3  Active Problems Urinary tract infection-status post ciprofloxacin.  Urine cultures are negative Acute/subacute stroke, acute metabolic encephalopathy - MRI showed stroke, 7 mm focus diffusion abnormality in the right paramedial pons consistent with acute to subacute infarct.  Multiple remote lacunar infarcts.  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  Neurology consulted, currently on aspirin and Plavix for 3 weeks and aspirin alone.  She is on a statin.  PT recommended SNF and placement is pending, difficult placement  Iron deficiency anemia-transfuse unit of packed red blood cells, hemoglobin repeat is stable.  Continue iron Chronic systolic CHF-echo showed an EF of 45-50%, continue beta-blockers, ARB Hypertension -continue medications as below  Scheduled Meds: . sodium chloride   Intravenous Once  . vitamin C  500 mg Oral  Daily  . aspirin EC  81 mg Oral Daily  . atorvastatin  40 mg Oral QPM  . clopidogrel  75 mg Oral Daily  . folic acid  1 mg Oral Daily  . hydrALAZINE  25 mg Oral BID  . insulin aspart  0-15 Units Subcutaneous TID WC  . linagliptin  5 mg Oral Daily  . losartan  25 mg Oral Daily  . metoprolol succinate  25 mg Oral Daily  . multivitamin with minerals  1 tablet Oral Daily  . senna  1 tablet Oral BID  . vitamin B-12  100 mcg Oral Daily  . zinc sulfate  220 mg Oral Daily   Continuous Infusions: PRN Meds:.acetaminophen, chlorpheniramine-HYDROcodone, guaiFENesin-dextromethorphan, Ipratropium-Albuterol, traMADol  Diet Orders (From admission, onward)    Start     Ordered   03/03/20 0000  Diet - low sodium heart healthy        03/03/20 1611   02/25/20 1727  Diet Carb Modified Fluid consistency: Thin; Room service appropriate? Yes  Diet effective now       Comments: Patient doesn't eat Pork.  Question Answer Comment  Diet-HS Snack? Nothing   Calorie Level Medium 1600-2000   Fluid consistency: Thin   Room service appropriate? Yes      02/25/20 1726          DVT prophylaxis:      Code Status: Full Code  Family Communication: no family at bedside, called niece went straight to voicemail  Status is: Inpatient  Remains inpatient appropriate because:Unsafe d/c plan   Dispo: The patient is from: Home  Anticipated d/c is to: SNF              Anticipated d/c date is: 2 days              Patient currently is medically stable to d/c.   Difficult to place patient Yes  Level of care: Med-Surg  Consultants:  none  Procedures:  None   Microbiology  None   Antimicrobials: None     Objective: Vitals:   03/04/20 1542 03/04/20 2033 03/05/20 0342 03/05/20 0903  BP: 112/70 121/69 118/73 135/79  Pulse: 88 92 77 93  Resp: 20 19 20 20   Temp: 99.4 F (37.4 C) 98.1 F (36.7 C) 97.9 F (36.6 C) 98 F (36.7 C)  TempSrc:    Oral  SpO2: 100% 100% 100%   Weight:       Height:       No intake or output data in the 24 hours ending 03/05/20 1019 Filed Weights   02/26/20 0800  Weight: 81.6 kg    Examination:  Constitutional: NAD Eyes: No icterus ENMT: mmm Neck: normal, supple Respiratory: Clear bilaterally without wheeze or crackles, normal respiratory effort Cardiovascular: Regular rate and rhythm, no murmurs, 1+ lower extremity edema Abdomen: Soft, NT, ND, bowel sounds positive Musculoskeletal: no clubbing / cyanosis.  Skin: No rashes appreciated Neurologic: No focal deficits   Data Reviewed: I have independently reviewed following labs and imaging studies   CBC: Recent Labs  Lab 02/28/20 0446 02/29/20 0241 03/01/20 0012 03/02/20 0901 03/05/20 0245  WBC 5.8 6.4  --   --  8.5  NEUTROABS 3.8 4.5  --   --   --   HGB 7.0* 6.6* 7.9* 9.6* 8.0*  HCT 21.2* 20.6* 23.7* 29.9* 25.9*  MCV 70.9* 70.1*  --   --  76.4*  PLT 546* 627*  --   --  539*   Basic Metabolic Panel: Recent Labs  Lab 02/28/20 0446 03/05/20 0245  NA 136 138  K 3.9 4.3  CL 104 106  CO2 22 21*  GLUCOSE 149* 124*  BUN 18 25*  CREATININE 0.89 1.17*  CALCIUM 8.3* 8.5*  MG 2.0  --   PHOS 4.2  --    Liver Function Tests: Recent Labs  Lab 02/28/20 0446  AST 13*  ALT 10  ALKPHOS 65  BILITOT 0.4  PROT 6.4*  ALBUMIN 2.2*   Coagulation Profile: No results for input(s): INR, PROTIME in the last 168 hours. HbA1C: No results for input(s): HGBA1C in the last 72 hours. CBG: Recent Labs  Lab 03/04/20 0813 03/04/20 1154 03/04/20 1623 03/04/20 2031 03/05/20 0738  GLUCAP 125* 165* 126* 184* 108*    No results found for this or any previous visit (from the past 240 hour(s)).   Radiology Studies: No results found.   05/03/20, MD, PhD [facility]  Between 7 am - 7 pm I am available, please contact me via Amion or Securechat  Between 7 pm - 7 am I am not available, please contact night coverage MD/APP via Amion

## 2020-03-06 LAB — TYPE AND SCREEN
ABO/RH(D): O POS
Antibody Screen: POSITIVE
DAT, IgG: NEGATIVE
Unit division: 0

## 2020-03-06 LAB — BASIC METABOLIC PANEL
Anion gap: 11 (ref 5–15)
BUN: 24 mg/dL — ABNORMAL HIGH (ref 8–23)
CO2: 21 mmol/L — ABNORMAL LOW (ref 22–32)
Calcium: 8.4 mg/dL — ABNORMAL LOW (ref 8.9–10.3)
Chloride: 106 mmol/L (ref 98–111)
Creatinine, Ser: 0.92 mg/dL (ref 0.44–1.00)
GFR, Estimated: >60 mL/min (ref >60)
Glucose, Bld: 118 mg/dL — ABNORMAL HIGH (ref 70–99)
Potassium: 4.1 mmol/L (ref 3.5–5.1)
Sodium: 138 mmol/L (ref 135–145)

## 2020-03-06 LAB — NO BLOOD PRODUCTS

## 2020-03-06 LAB — CBC
HCT: 22.9 % — ABNORMAL LOW (ref 36.0–46.0)
Hemoglobin: 7.3 g/dL — ABNORMAL LOW (ref 12.0–15.0)
MCH: 23.9 pg — ABNORMAL LOW (ref 26.0–34.0)
MCHC: 31.9 g/dL (ref 30.0–36.0)
MCV: 74.8 fL — ABNORMAL LOW (ref 80.0–100.0)
Platelets: 444 10*3/uL — ABNORMAL HIGH (ref 150–400)
RBC: 3.06 MIL/uL — ABNORMAL LOW (ref 3.87–5.11)
RDW: 22.1 % — ABNORMAL HIGH (ref 11.5–15.5)
WBC: 9.3 10*3/uL (ref 4.0–10.5)
nRBC: 0 % (ref 0.0–0.2)

## 2020-03-06 LAB — BPAM RBC
Blood Product Expiration Date: 202202262359
Unit Type and Rh: 5100

## 2020-03-06 LAB — PREPARE RBC (CROSSMATCH)

## 2020-03-06 LAB — GLUCOSE, CAPILLARY
Glucose-Capillary: 95 mg/dL (ref 70–99)
Glucose-Capillary: 124 mg/dL — ABNORMAL HIGH (ref 70–99)
Glucose-Capillary: 206 mg/dL — ABNORMAL HIGH (ref 70–99)
Glucose-Capillary: 173 mg/dL — ABNORMAL HIGH (ref 70–99)

## 2020-03-06 MED ORDER — SODIUM CHLORIDE 0.9% IV SOLUTION: Freq: Once | INTRAVENOUS | Status: AC

## 2020-03-06 MED ORDER — FERROUS GLUCONATE 324 (38 FE) MG PO TABS
324.0000 mg | ORAL_TABLET | Freq: Three times a day (TID) | ORAL | Status: DC
  Administered 2020-03-06 – 2020-03-10 (×13): 324 mg via ORAL
  Filled 2020-03-06 (×15): qty 1

## 2020-03-06 NOTE — Progress Notes (Signed)
Patient refused blood, MD aware.

## 2020-03-06 NOTE — Progress Notes (Signed)
Physical Therapy Treatment Patient Details Name: Ruth Reynolds MRN: [id number] DOB: September 20, 1950 Today's Date: 03/06/2020    History of Present Illness Pt is a 70 y/o female admitted after being on floor for a day, pt states she fell then slept there and refused to get up. Found to be COVID + with encephalopathy. PMH includes DM, anemia, CHF, and SDH.    PT Comments    Pt very reluctant to even get up with therapy not to mention walk anywhere with me and will not report why.  She was sleeping in the dark when I came in, so I turned lights on, blinds open and started playing some upbeat music to try to motivate her.  It did lighten her mood and produce a few smiles, but she was not going to walk the hallway with me.  I assisted her to the bathroom and back to the chair motivated by toileting and a wet bed.  PT will continue to follow acutely for safe mobility progression.     Follow Up Recommendations  SNF;Other (comment) (looking more like custodial SNF, although she would benefit from rehab first if agreeable.)     Equipment Recommendations  Rolling walker with 5" wheels    Recommendations for Other Services       Precautions / Restrictions Precautions Precautions: Fall    Mobility  Bed Mobility Overal bed mobility: Needs Assistance Bed Mobility: Supine to Sit     Supine to sit: Mod assist;HOB elevated     General bed mobility comments: Mod assist only because pt was relunctant to get up on her own.  Transfers Overall transfer level: Needs assistance Equipment used: Rolling walker (2 wheeled) Transfers: Sit to/from Stand Sit to Stand: Min assist         General transfer comment: Min assist to stand with RW from elevated bed and elevated BSC over toilet in bathroom.  Ambulation/Gait Ambulation/Gait assistance: Min assist Gait Distance (Feet): 20 Feet Assistive device: Rolling walker (2 wheeled) Gait Pattern/deviations: Step-through pattern;Shuffle Gait velocity:  Decreased   General Gait Details: Cues for closer proximity to RW and to keep feet inside of perimeter of RW during turns.  Assist to maneuver RW today and min assist for balance as pt tends to park RW and sit prematurely.   Stairs             Wheelchair Mobility    Modified Rankin (Stroke Patients Only)       Balance Overall balance assessment: Needs assistance Sitting-balance support: Feet supported;Bilateral upper extremity supported;No upper extremity supported Sitting balance-Leahy Scale: Fair     Standing balance support: Bilateral upper extremity supported Standing balance-Leahy Scale: Poor Standing balance comment: RW for standing and gait                            Cognition Arousal/Alertness: Awake/alert Behavior During Therapy: WFL for tasks assessed/performed Overall Cognitive Status: No family/caregiver present to determine baseline cognitive functioning Area of Impairment: Following commands;Problem solving                       Following Commands: Follows one step commands with increased time     Problem Solving: Slow processing;Decreased initiation General Comments: Pt smiling at times when PT turned on music and started dancing, would not say much today, only snicker and laugh at times.  Seems fatigued and very self limiting.      Exercises  General Comments General comments (skin integrity, edema, etc.): Significant amount of time spent waking pt from sleeping and motivating her to move.  She would not report why she did not want to get up and walk. Music helped improve her mood, but not her motivation.      Pertinent Vitals/Pain Pain Assessment: Faces Faces Pain Scale: No hurt    Home Living                      Prior Function            PT Goals (current goals can now be found in the care plan section) Acute Rehab PT Goals Patient Stated Goal: none stated Progress towards PT goals: Not progressing  toward goals - comment (self limiting today)    Frequency    Min 2X/week      PT Plan Current plan remains appropriate    Co-evaluation              AM-PAC PT "6 Clicks" Mobility   Outcome Measure  Help needed turning from your back to your side while in a flat bed without using bedrails?: A Little Help needed moving from lying on your back to sitting on the side of a flat bed without using bedrails?: A Lot Help needed moving to and from a bed to a chair (including a wheelchair)?: A Little Help needed standing up from a chair using your arms (e.g., wheelchair or bedside chair)?: A Little Help needed to walk in hospital room?: A Little Help needed climbing 3-5 steps with a railing? : A Lot 6 Click Score: 16    End of Session Equipment Utilized During Treatment: Gait belt Activity Tolerance: Patient limited by fatigue Patient left: in chair;with call bell/phone within reach;with chair alarm set Nurse Communication: Mobility status;Other (comment) (to RN tech) PT Visit Diagnosis: Unsteadiness on feet (R26.81);Muscle weakness (generalized) (M62.81);History of falling (Z91.81)     Time: [phone] (no charge for time spent convincing her to get up) PT Time Calculation (min) (ACUTE ONLY): 52 min  Charges:  $Gait Training: 8-22 mins $Therapeutic Activity: 8-22 mins                     Corinna Capra, PT, DPT  Acute Rehabilitation [phone] pager [phone]) [phone] office

## 2020-03-06 NOTE — Progress Notes (Signed)
PROGRESS NOTE  Ruth Reynolds ZOX:[id number] DOB: 08/07/1950 DOA: 02/23/2020 PCP: Patient, No Pcp Per   LOS: 11 days   Brief Narrative / Interim history: 70 year old female with DM 2, chronic anemia, prior SDH, chronic diastolic CHF came into the hospital via EMS after being found on the floor.  Patient had a fall the day prior to admission has been on the floor for roughly 24 hours.  Currently lives with her niece.  There are reports of generalized weakness and increased fatigue.  She was found to be Covid positive, had no significant respiratory symptoms, but presented with metabolic encephalopathy.  She was also found to have a stroke and new cardiomyopathy, and placed on aspirin and Plavix for 3 weeks then aspirin alone.  Subjective / 24h Interval events: Eating breakfast, smiling, no significant complaints  Assessment & Plan: Principal Problem COVID-19 infection-due to high risk, she received 3 days of Remdesivir.  She was positive for Covid on 1/23, of quarantine on 2/3.  Remains without respiratory involvement  Active Problems Urinary tract infection-status post ciprofloxacin.  Urine cultures are negative Acute/subacute stroke, acute metabolic encephalopathy - MRI showed stroke, 7 mm focus diffusion abnormality in the right paramedial pons consistent with acute to subacute infarct.  Multiple remote lacunar infarcts.  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  Neurology consulted, currently on aspirin and Plavix for 3 weeks and aspirin alone.  She is on a statin.  PT recommended SNF and placement is pending, difficult placement  Iron deficiency anemia-transfuse unit of packed red blood cells, hemoglobin repeat is stable.  Transfuse another unit of packed red blood cells today.  No bleeding.  Continue iron Chronic systolic CHF-echo showed an EF of 45-50%, continue beta-blockers,  ARB Hypertension -continue medications as below  Scheduled Meds: . sodium chloride   Intravenous Once  . sodium chloride   Intravenous Once  . vitamin C  500 mg Oral Daily  . aspirin EC  81 mg Oral Daily  . atorvastatin  40 mg Oral QPM  . clopidogrel  75 mg Oral Daily  . ferrous gluconate  324 mg Oral TID WC  . folic acid  1 mg Oral Daily  . hydrALAZINE  25 mg Oral BID  . insulin aspart  0-15 Units Subcutaneous TID WC  . linagliptin  5 mg Oral Daily  . losartan  25 mg Oral Daily  . metoprolol succinate  25 mg Oral Daily  . multivitamin with minerals  1 tablet Oral Daily  . senna  1 tablet Oral BID  . vitamin B-12  100 mcg Oral Daily  . zinc sulfate  220 mg Oral Daily   Continuous Infusions: PRN Meds:.acetaminophen, chlorpheniramine-HYDROcodone, guaiFENesin-dextromethorphan, Ipratropium-Albuterol, traMADol  Diet Orders (From admission, onward)    Start     Ordered   03/03/20 0000  Diet - low sodium heart healthy        03/03/20 1611   02/25/20 1727  Diet Carb Modified Fluid consistency: Thin; Room service appropriate? Yes  Diet effective now       Comments: Patient doesn't eat Pork.  Question Answer Comment  Diet-HS Snack? Nothing   Calorie Level Medium 1600-2000   Fluid consistency: Thin   Room service appropriate? Yes      02/25/20 1726          DVT prophylaxis:      Code Status: Full Code  Family Communication: no family at bedside, called niece went straight to voicemail  Status is: Inpatient  Remains inpatient appropriate because:Unsafe d/c plan   Dispo: The patient is from: Home              Anticipated d/c is to: SNF              Anticipated d/c date is: 2 days              Patient currently is medically stable to d/c.   Difficult to place patient Yes  Level of care: Med-Surg  Consultants:  none  Procedures:  None   Microbiology  None   Antimicrobials: None     Objective: Vitals:   03/05/20 0903 03/05/20 1634 03/05/20 2034 03/06/20  0822  BP: 135/79 130/74 138/80 (!) 127/57  Pulse: 93 80 90 71  Resp: 20 20 20 20   Temp: 98 F (36.7 C)  98.2 F (36.8 C) 98.5 F (36.9 C)  TempSrc: Oral  Oral Oral  SpO2:  100% 100% 95%  Weight:      Height:        Intake/Output Summary (Last 24 hours) at 03/06/2020 1142 Last data filed at 03/06/2020 0131 Gross per 24 hour  Intake --  Output 600 ml  Net -600 ml   Filed Weights   02/26/20 0800  Weight: 81.6 kg    Examination:  Constitutional: No distress Eyes: No icterus ENMT: Moist mucous membranes Neck: normal, supple Respiratory: Clear bilaterally, no wheezing, no crackles, normal respiratory effort Cardiovascular: Regular rate and rhythm, no murmurs, 1+ edema Abdomen: Soft, NT, ND, bowel sounds positive Musculoskeletal: no clubbing / cyanosis.  Skin: No rashes seen Neurologic: Nonfocal   Data Reviewed: I have independently reviewed following labs and imaging studies   CBC: Recent Labs  Lab 02/29/20 0241 03/01/20 0012 03/02/20 0901 03/05/20 0245 03/06/20 0230  WBC 6.4  --   --  8.5 9.3  NEUTROABS 4.5  --   --   --   --   HGB 6.6* 7.9* 9.6* 8.0* 7.3*  HCT 20.6* 23.7* 29.9* 25.9* 22.9*  MCV 70.1*  --   --  76.4* 74.8*  PLT 627*  --   --  539* 444*   Basic Metabolic Panel: Recent Labs  Lab 03/05/20 0245 03/06/20 0230  NA 138 138  K 4.3 4.1  CL 106 106  CO2 21* 21*  GLUCOSE 124* 118*  BUN 25* 24*  CREATININE 1.17* 0.92  CALCIUM 8.5* 8.4*   Liver Function Tests: No results for input(s): AST, ALT, ALKPHOS, BILITOT, PROT, ALBUMIN in the last 168 hours. Coagulation Profile: No results for input(s): INR, PROTIME in the last 168 hours. HbA1C: No results for input(s): HGBA1C in the last 72 hours. CBG: Recent Labs  Lab 03/05/20 1243 03/05/20 1634 03/05/20 2036 03/06/20 0821 03/06/20 1133  GLUCAP 183* 173* 175* 95 124*    No results found for this or any previous visit (from the past 240 hour(s)).   Radiology Studies: No results  found.   05/04/20, MD, PhD [facility]  Between 7 am - 7 pm I am available, please contact me via Amion or Securechat  Between 7 pm - 7 am I am not available, please contact night coverage MD/APP via Amion

## 2020-03-06 NOTE — TOC Progression Note (Addendum)
Transition of Care [facility]) - Progression Note    Patient Details  Name: Ruth Reynolds MRN: [id number] Date of Birth: 11-Jun-1950  [facility]) CM/SW Contact  Beckie Busing, RN Phone Number: [phone]  03/06/2020, 8:38 AM  Clinical Narrative:    CM has left message for Star @ [facility] to inquire if facility can accept patient as covid recovery since patient is now off of covid isolation. Insurance has been verified and patient is [employer]. Accepting facility will havehh to initiate insurance authorization.   1015 Spoke with [place] at [facility]. [facility] is unable to offer patient a bed due to niece reports that this is long term placement. Niece will not be able to care for aunt at this time. CM spoke with niece Bernadene Person who confirms that she is unable to take care of patient at this time. [facility] has been update for covid recovery and long term and info has been sent for bed offers.   1350 Received bed offer from [facility] however unable to complete bed offer due to [facility] is not in network. CM will continue to follow.     Expected Discharge Plan: Skilled Nursing Facility Barriers to Discharge: Continued Medical Work up  Expected Discharge Plan and Services Expected Discharge Plan: Skilled Nursing Facility In-house Referral: NA Discharge Planning Services: CM Consult   Living arrangements for the past 2 months: Single Family Home                 DME Arranged: N/A DME Agency: NA                   Social Determinants of Health (SDOH) Interventions    Readmission Risk Interventions No flowsheet data found.

## 2020-03-07 LAB — CBC
HCT: 24.2 % — ABNORMAL LOW (ref 36.0–46.0)
Hemoglobin: 7.9 g/dL — ABNORMAL LOW (ref 12.0–15.0)
MCH: 24.6 pg — ABNORMAL LOW (ref 26.0–34.0)
MCHC: 32.6 g/dL (ref 30.0–36.0)
MCV: 75.4 fL — ABNORMAL LOW (ref 80.0–100.0)
Platelets: 469 10*3/uL — ABNORMAL HIGH (ref 150–400)
RBC: 3.21 MIL/uL — ABNORMAL LOW (ref 3.87–5.11)
RDW: 22.3 % — ABNORMAL HIGH (ref 11.5–15.5)
WBC: 10.5 10*3/uL (ref 4.0–10.5)
nRBC: 0 % (ref 0.0–0.2)

## 2020-03-07 LAB — COMPREHENSIVE METABOLIC PANEL
ALT: 12 U/L (ref 0–44)
AST: 17 U/L (ref 15–41)
Albumin: 2.4 g/dL — ABNORMAL LOW (ref 3.5–5.0)
Alkaline Phosphatase: 69 U/L (ref 38–126)
Anion gap: 11 (ref 5–15)
BUN: 24 mg/dL — ABNORMAL HIGH (ref 8–23)
CO2: 22 mmol/L (ref 22–32)
Calcium: 8.7 mg/dL — ABNORMAL LOW (ref 8.9–10.3)
Chloride: 103 mmol/L (ref 98–111)
Creatinine, Ser: 1.07 mg/dL — ABNORMAL HIGH (ref 0.44–1.00)
GFR, Estimated: 56 mL/min — ABNORMAL LOW (ref >60)
Glucose, Bld: 163 mg/dL — ABNORMAL HIGH (ref 70–99)
Potassium: 4.4 mmol/L (ref 3.5–5.1)
Sodium: 136 mmol/L (ref 135–145)
Total Bilirubin: 0.4 mg/dL (ref 0.3–1.2)
Total Protein: 7.4 g/dL (ref 6.5–8.1)

## 2020-03-07 LAB — GLUCOSE, CAPILLARY
Glucose-Capillary: 135 mg/dL — ABNORMAL HIGH (ref 70–99)
Glucose-Capillary: 161 mg/dL — ABNORMAL HIGH (ref 70–99)
Glucose-Capillary: 199 mg/dL — ABNORMAL HIGH (ref 70–99)
Glucose-Capillary: 196 mg/dL — ABNORMAL HIGH (ref 70–99)

## 2020-03-07 NOTE — Progress Notes (Signed)
PROGRESS NOTE  Ruth Reynolds WKM:[id number] DOB: [birth year] DOA: 02/23/2020 PCP: Patient, No Pcp Per   LOS: 12 days   Brief Narrative / Interim history: 71 year old female with DM 2, chronic anemia, prior SDH, chronic diastolic CHF came into the hospital via EMS after being found on the floor.  Patient had a fall the day prior to admission has been on the floor for roughly 24 hours.  Currently lives with her niece.  There are reports of generalized weakness and increased fatigue.  She was found to be Covid positive, had no significant respiratory symptoms, but presented with metabolic encephalopathy.  She was also found to have a stroke and new cardiomyopathy, and placed on aspirin and Plavix for 3 weeks then aspirin alone.  Subjective / 24h Interval events: No complaints today, no shortness of breath, no chest pain  Assessment & Plan: Principal Problem COVID-19 infection-due to high risk, she received 3 days of Remdesivir.  She was positive for Covid on 1/23, of quarantine on 2/3.  No respiratory involvement  Active Problems Urinary tract infection-status post ciprofloxacin.  Urine cultures are negative Acute/subacute stroke, acute metabolic encephalopathy - MRI showed stroke, 7 mm focus diffusion abnormality in the right paramedial pons consistent with acute to subacute infarct.  Multiple remote lacunar infarcts.  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  Neurology consulted, currently on aspirin and Plavix for 3 weeks and aspirin alone.  She is on a statin.  PT recommended SNF and placement is pending Iron deficiency anemia-hemoglobin dipped to 7.3 on 2/4, she refused transfusion.  Closely monitor, improving on its own today to 7.9. Chronic systolic CHF-echo showed an EF of 45-50%, continue beta-blockers, ARB Hypertension -continue medications as below  Scheduled Meds: .  vitamin C  500 mg Oral Daily  . aspirin EC  81 mg Oral Daily  . atorvastatin  40 mg Oral QPM  . clopidogrel  75 mg Oral Daily  . ferrous gluconate  324 mg Oral TID WC  . folic acid  1 mg Oral Daily  . hydrALAZINE  25 mg Oral BID  . insulin aspart  0-15 Units Subcutaneous TID WC  . linagliptin  5 mg Oral Daily  . losartan  25 mg Oral Daily  . metoprolol succinate  25 mg Oral Daily  . multivitamin with minerals  1 tablet Oral Daily  . senna  1 tablet Oral BID  . vitamin B-12  100 mcg Oral Daily  . zinc sulfate  220 mg Oral Daily   Continuous Infusions: PRN Meds:.acetaminophen, chlorpheniramine-HYDROcodone, guaiFENesin-dextromethorphan, Ipratropium-Albuterol, traMADol  Diet Orders (From admission, onward)    Start     Ordered   03/03/20 0000  Diet - low sodium heart healthy        03/03/20 1611   02/25/20 1727  Diet Carb Modified Fluid consistency: Thin; Room service appropriate? Yes  Diet effective now       Comments: Patient doesn't eat Pork.  Question Answer Comment  Diet-HS Snack? Nothing   Calorie Level Medium 1600-2000   Fluid consistency: Thin   Room service appropriate? Yes      02/25/20 1726          DVT prophylaxis:      Code Status: Full Code  Family Communication: no family at bedside, niece over the phone  Status is: Inpatient  Remains inpatient appropriate because:Unsafe d/c plan  Dispo: The patient is from: Home              Anticipated d/c is to: SNF              Anticipated d/c date is: 2 days              Patient currently is medically stable to d/c.   Difficult to place patient Yes  Level of care: Med-Surg  Consultants:  none  Procedures:  None   Microbiology  None   Antimicrobials: None     Objective: Vitals:   03/06/20 0822 03/06/20 2201 03/07/20 0603 03/07/20 0936  BP: (!) 127/57 130/67 117/61 116/72  Pulse: 71 89 77   Resp: 20 16 14 17   Temp: 98.5 F (36.9 C) 98.9 F (37.2 C) 98.6 F (37 C) 98.7 F (37.1 C)   TempSrc: Oral   Oral  SpO2: 95% 100% 100% 100%  Weight:      Height:        Intake/Output Summary (Last 24 hours) at 03/07/2020 1031 Last data filed at 03/06/2020 2243 Gross per 24 hour  Intake --  Output 300 ml  Net -300 ml   Filed Weights   02/26/20 0800  Weight: 81.6 kg    Examination:  Constitutional: No distress Eyes: No icterus ENMT: mmm Neck: normal, supple Respiratory: Clear bilaterally, no wheezing, no crackles Cardiovascular: Regular rate and rhythm, no murmurs, trace edema Abdomen: Soft, nontender, nondistended, bowel sounds positive Musculoskeletal: no clubbing / cyanosis.  Skin: No rashes seen Neurologic: No focal deficits   Data Reviewed: I have independently reviewed following labs and imaging studies   CBC: Recent Labs  Lab 03/01/20 0012 03/02/20 0901 03/05/20 0245 03/06/20 0230 03/07/20 0204  WBC  --   --  8.5 9.3 10.5  HGB 7.9* 9.6* 8.0* 7.3* 7.9*  HCT 23.7* 29.9* 25.9* 22.9* 24.2*  MCV  --   --  76.4* 74.8* 75.4*  PLT  --   --  539* 444* 469*   Basic Metabolic Panel: Recent Labs  Lab 03/05/20 0245 03/06/20 0230 03/07/20 0204  NA 138 138 136  K 4.3 4.1 4.4  CL 106 106 103  CO2 21* 21* 22  GLUCOSE 124* 118* 163*  BUN 25* 24* 24*  CREATININE 1.17* 0.92 1.07*  CALCIUM 8.5* 8.4* 8.7*   Liver Function Tests: Recent Labs  Lab 03/07/20 0204  AST 17  ALT 12  ALKPHOS 69  BILITOT 0.4  PROT 7.4  ALBUMIN 2.4*   Coagulation Profile: No results for input(s): INR, PROTIME in the last 168 hours. HbA1C: No results for input(s): HGBA1C in the last 72 hours. CBG: Recent Labs  Lab 03/06/20 0821 03/06/20 1133 03/06/20 1713 03/06/20 2018 03/07/20 0733  GLUCAP 95 124* 206* 173* 135*    No results found for this or any previous visit (from the past 240 hour(s)).   Radiology Studies: No results found.   05/05/20, MD, PhD [facility]  Between 7 am - 7 pm I am available, please contact me via Amion or  Securechat  Between 7 pm - 7 am I am not available, please contact night coverage MD/APP via Amion

## 2020-03-08 LAB — GLUCOSE, CAPILLARY
Glucose-Capillary: 110 mg/dL — ABNORMAL HIGH (ref 70–99)
Glucose-Capillary: 173 mg/dL — ABNORMAL HIGH (ref 70–99)
Glucose-Capillary: 149 mg/dL — ABNORMAL HIGH (ref 70–99)
Glucose-Capillary: 183 mg/dL — ABNORMAL HIGH (ref 70–99)

## 2020-03-08 NOTE — Progress Notes (Signed)
PROGRESS NOTE  Ruth Reynolds XNA:[id number] DOB: 11-05-1950 DOA: 02/23/2020 PCP: Patient, No Pcp Per   LOS: 13 days   Brief Narrative / Interim history: 69 year old female with DM 2, chronic anemia, prior SDH, chronic diastolic CHF came into the hospital via EMS after being found on the floor.  Patient had a fall the day prior to admission has been on the floor for roughly 24 hours.  Currently lives with her niece.  There are reports of generalized weakness and increased fatigue.  She was found to be Covid positive, had no significant respiratory symptoms, but presented with metabolic encephalopathy.  She was also found to have a stroke and new cardiomyopathy, and placed on aspirin and Plavix for 3 weeks then aspirin alone.  Subjective / 24h Interval events: No complaints  Assessment & Plan: Principal Problem COVID-19 infection-due to high risk, she received 3 days of Remdesivir.  She was positive for Covid on 1/23, off quarantine on 2/3.  No respiratory involvement  Active Problems Urinary tract infection-status post ciprofloxacin.  Urine cultures are negative Acute/subacute stroke, acute metabolic encephalopathy - MRI showed stroke, 7 mm focus diffusion abnormality in the right paramedial pons consistent with acute to subacute infarct.  Multiple remote lacunar infarcts.  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  Neurology consulted, currently on aspirin and Plavix for 3 weeks and aspirin alone.  She is on a statin.  PT recommended SNF and placement is pending Iron deficiency anemia-hemoglobin dipped to 7.3 on 2/4, she refused transfusion.  Closely monitor, improving on its own today to 7.9. Chronic systolic CHF-echo showed an EF of 45-50%, continue beta-blockers, ARB Hypertension -continue medications as below  Scheduled Meds: . vitamin C  500 mg Oral Daily  . aspirin EC  81 mg  Oral Daily  . atorvastatin  40 mg Oral QPM  . clopidogrel  75 mg Oral Daily  . ferrous gluconate  324 mg Oral TID WC  . folic acid  1 mg Oral Daily  . hydrALAZINE  25 mg Oral BID  . insulin aspart  0-15 Units Subcutaneous TID WC  . linagliptin  5 mg Oral Daily  . losartan  25 mg Oral Daily  . metoprolol succinate  25 mg Oral Daily  . multivitamin with minerals  1 tablet Oral Daily  . senna  1 tablet Oral BID  . vitamin B-12  100 mcg Oral Daily  . zinc sulfate  220 mg Oral Daily   Continuous Infusions: PRN Meds:.acetaminophen, chlorpheniramine-HYDROcodone, guaiFENesin-dextromethorphan, Ipratropium-Albuterol, traMADol  Diet Orders (From admission, onward)    Start     Ordered   03/03/20 0000  Diet - low sodium heart healthy        03/03/20 1611   02/25/20 1727  Diet Carb Modified Fluid consistency: Thin; Room service appropriate? Yes  Diet effective now       Comments: Patient doesn't eat Pork.  Question Answer Comment  Diet-HS Snack? Nothing   Calorie Level Medium 1600-2000   Fluid consistency: Thin   Room service appropriate? Yes      02/25/20 1726          DVT prophylaxis:      Code Status: Full Code  Family Communication: no family at bedside, niece over the phone  Status is: Inpatient  Remains inpatient appropriate because:Unsafe d/c plan   Dispo: The patient is from: Home  Anticipated d/c is to: SNF              Anticipated d/c date is: 1 day              Patient currently is medically stable to d/c.   Difficult to place patient Yes  Level of care: Med-Surg  Consultants:  none  Procedures:  None   Microbiology  None   Antimicrobials: None     Objective: Vitals:   03/07/20 1631 03/07/20 2210 03/08/20 0614 03/08/20 0911  BP: 124/74 119/67 (!) 167/74 (!) 156/74  Pulse: 88 75 64 73  Resp: 18 16 14 16   Temp: 98.7 F (37.1 C) 98.5 F (36.9 C) 97.7 F (36.5 C) 98 F (36.7 C)  TempSrc: Oral   Oral  SpO2: 92% 99% 100% 100%   Weight:      Height:       No intake or output data in the 24 hours ending 03/08/20 1048 Filed Weights   02/26/20 0800  Weight: 81.6 kg    Examination:  Constitutional: No distress Respiratory: cta Cardiovascular: rrr, trace edema   Data Reviewed: I have independently reviewed following labs and imaging studies   CBC: Recent Labs  Lab 03/02/20 0901 03/05/20 0245 03/06/20 0230 03/07/20 0204  WBC  --  8.5 9.3 10.5  HGB 9.6* 8.0* 7.3* 7.9*  HCT 29.9* 25.9* 22.9* 24.2*  MCV  --  76.4* 74.8* 75.4*  PLT  --  539* 444* 469*   Basic Metabolic Panel: Recent Labs  Lab 03/05/20 0245 03/06/20 0230 03/07/20 0204  NA 138 138 136  K 4.3 4.1 4.4  CL 106 106 103  CO2 21* 21* 22  GLUCOSE 124* 118* 163*  BUN 25* 24* 24*  CREATININE 1.17* 0.92 1.07*  CALCIUM 8.5* 8.4* 8.7*   Liver Function Tests: Recent Labs  Lab 03/07/20 0204  AST 17  ALT 12  ALKPHOS 69  BILITOT 0.4  PROT 7.4  ALBUMIN 2.4*   Coagulation Profile: No results for input(s): INR, PROTIME in the last 168 hours. HbA1C: No results for input(s): HGBA1C in the last 72 hours. CBG: Recent Labs  Lab 03/07/20 0733 03/07/20 1222 03/07/20 1638 03/07/20 2210 03/08/20 0821  GLUCAP 135* 161* 199* 196* 110*    No results found for this or any previous visit (from the past 240 hour(s)).   Radiology Studies: No results found.   05/06/20, MD, PhD [facility]  Between 7 am - 7 pm I am available, please contact me via Amion or Securechat  Between 7 pm - 7 am I am not available, please contact night coverage MD/APP via Amion

## 2020-03-08 NOTE — Plan of Care (Signed)
  Problem: Education: Goal: Knowledge of risk factors and measures for prevention of condition will improve Outcome: Progressing   Problem: Coping: Goal: Psychosocial and spiritual needs will be supported Outcome: Progressing   Problem: Education: Goal: Knowledge of secondary prevention will improve Outcome: Progressing Goal: Knowledge of patient specific risk factors addressed and post discharge goals established will improve Outcome: Progressing Goal: Individualized Educational Video(s) Outcome: Progressing   Problem: Coping: Goal: Will identify appropriate support needs Outcome: Progressing

## 2020-03-09 LAB — CBC
HCT: 22.8 % — ABNORMAL LOW (ref 36.0–46.0)
Hemoglobin: 7.4 g/dL — ABNORMAL LOW (ref 12.0–15.0)
MCH: 24.4 pg — ABNORMAL LOW (ref 26.0–34.0)
MCHC: 32.5 g/dL (ref 30.0–36.0)
MCV: 75.2 fL — ABNORMAL LOW (ref 80.0–100.0)
Platelets: 396 10*3/uL (ref 150–400)
RBC: 3.03 MIL/uL — ABNORMAL LOW (ref 3.87–5.11)
RDW: 22.1 % — ABNORMAL HIGH (ref 11.5–15.5)
WBC: 6.6 10*3/uL (ref 4.0–10.5)
nRBC: 0 % (ref 0.0–0.2)

## 2020-03-09 LAB — BASIC METABOLIC PANEL
Anion gap: 8 (ref 5–15)
BUN: 20 mg/dL (ref 8–23)
CO2: 23 mmol/L (ref 22–32)
Calcium: 8.4 mg/dL — ABNORMAL LOW (ref 8.9–10.3)
Chloride: 106 mmol/L (ref 98–111)
Creatinine, Ser: 0.76 mg/dL (ref 0.44–1.00)
GFR, Estimated: >60 mL/min (ref >60)
Glucose, Bld: 132 mg/dL — ABNORMAL HIGH (ref 70–99)
Potassium: 4.2 mmol/L (ref 3.5–5.1)
Sodium: 137 mmol/L (ref 135–145)

## 2020-03-09 LAB — GLUCOSE, CAPILLARY
Glucose-Capillary: 100 mg/dL — ABNORMAL HIGH (ref 70–99)
Glucose-Capillary: 157 mg/dL — ABNORMAL HIGH (ref 70–99)
Glucose-Capillary: 152 mg/dL — ABNORMAL HIGH (ref 70–99)
Glucose-Capillary: 218 mg/dL — ABNORMAL HIGH (ref 70–99)
Glucose-Capillary: 237 mg/dL — ABNORMAL HIGH (ref 70–99)

## 2020-03-09 NOTE — Progress Notes (Signed)
PROGRESS NOTE  Ruth Reynolds [id number]:[id number] DOB: 1950-05-09 DOA: 02/23/2020 PCP: Patient, No Pcp Per   LOS: 14 days   Brief Narrative / Interim history: 70 year old female with DM 2, chronic anemia, prior SDH, chronic diastolic CHF came into the hospital via EMS after being found on the floor.  Patient had a fall the day prior to admission has been on the floor for roughly 24 hours.  Currently lives with her niece.  There are reports of generalized weakness and increased fatigue.  She was found to be Covid positive, had no significant respiratory symptoms, but presented with metabolic encephalopathy.  She was also found to have a stroke and new cardiomyopathy, and placed on aspirin and Plavix for 3 weeks then aspirin alone.  Subjective / 24h Interval events: No complaints   Assessment & Plan: Principal Problem COVID-19 infection-due to high risk, she received 3 days of Remdesivir.  She was positive for Covid on 1/23, off quarantine on 2/3.  No respiratory involvement  Active Problems Urinary tract infection-status post ciprofloxacin.  Urine cultures are negative Acute/subacute stroke, acute metabolic encephalopathy - MRI showed stroke, 7 mm focus diffusion abnormality in the right paramedial pons consistent with acute to subacute infarct.  Multiple remote lacunar infarcts.  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  Neurology consulted, currently on aspirin and Plavix for 3 weeks and aspirin alone.  She is on a statin.  PT recommended SNF and placement is pending Iron deficiency anemia-hemoglobin dipped to 7.3 on 2/4, she refused transfusion.  Closely monitor, improving on its own today to 7.9. Chronic systolic CHF-echo showed an EF of 45-50%, continue beta-blockers, ARB Hypertension -continue medications as below  Scheduled Meds: . vitamin C  500 mg Oral Daily  . aspirin EC  81  mg Oral Daily  . atorvastatin  40 mg Oral QPM  . clopidogrel  75 mg Oral Daily  . ferrous gluconate  324 mg Oral TID WC  . folic acid  1 mg Oral Daily  . hydrALAZINE  25 mg Oral BID  . insulin aspart  0-15 Units Subcutaneous TID WC  . linagliptin  5 mg Oral Daily  . losartan  25 mg Oral Daily  . metoprolol succinate  25 mg Oral Daily  . multivitamin with minerals  1 tablet Oral Daily  . senna  1 tablet Oral BID  . vitamin B-12  100 mcg Oral Daily  . zinc sulfate  220 mg Oral Daily   Continuous Infusions: PRN Meds:.acetaminophen, chlorpheniramine-HYDROcodone, guaiFENesin-dextromethorphan, Ipratropium-Albuterol, traMADol  Diet Orders (From admission, onward)    Start     Ordered   03/03/20 0000  Diet - low sodium heart healthy        03/03/20 1611   02/25/20 1727  Diet Carb Modified Fluid consistency: Thin; Room service appropriate? Yes  Diet effective now       Comments: Patient doesn't eat Pork.  Question Answer Comment  Diet-HS Snack? Nothing   Calorie Level Medium 1600-2000   Fluid consistency: Thin   Room service appropriate? Yes      02/25/20 1726          DVT prophylaxis:      Code Status: Full Code  Family Communication: no family at bedside, niece over the phone 2/5  Status is: Inpatient  Remains inpatient appropriate because:Unsafe d/c plan   Dispo: The patient is from: Home  Anticipated d/c is to: SNF              Anticipated d/c date is: 1 day              Patient currently is medically stable to d/c.   Difficult to place patient Yes  Level of care: Med-Surg  Consultants:  none  Procedures:  None   Microbiology  None   Antimicrobials: None     Objective: Vitals:   03/08/20 1915 03/08/20 2308 03/09/20 0337 03/09/20 0800  BP: (!) 146/89 (!) 146/86 134/73 (!) 151/72  Pulse: 100 89 69 66  Resp: 20 18 18 18   Temp: 98.1 F (36.7 C) 98.8 F (37.1 C) 97.7 F (36.5 C) 97.9 F (36.6 C)  TempSrc: Oral Oral Oral Oral  SpO2:  94% 100% 100% 100%  Weight:      Height:        Intake/Output Summary (Last 24 hours) at 03/09/2020 1321 Last data filed at 03/09/2020 05/07/2020 Gross per 24 hour  Intake --  Output 320 ml  Net -320 ml   Filed Weights   02/26/20 0800  Weight: 81.6 kg    Examination:  Constitutional: NAD Respiratory: CTA Cardiovascular: RRR, trace edema    Data Reviewed: I have independently reviewed following labs and imaging studies   CBC: Recent Labs  Lab 03/05/20 0245 03/06/20 0230 03/07/20 0204 03/09/20 0204  WBC 8.5 9.3 10.5 6.6  HGB 8.0* 7.3* 7.9* 7.4*  HCT 25.9* 22.9* 24.2* 22.8*  MCV 76.4* 74.8* 75.4* 75.2*  PLT 539* 444* 469* 396   Basic Metabolic Panel: Recent Labs  Lab 03/05/20 0245 03/06/20 0230 03/07/20 0204 03/09/20 0204  NA 138 138 136 137  K 4.3 4.1 4.4 4.2  CL 106 106 103 106  CO2 21* 21* 22 23  GLUCOSE 124* 118* 163* 132*  BUN 25* 24* 24* 20  CREATININE 1.17* 0.92 1.07* 0.76  CALCIUM 8.5* 8.4* 8.7* 8.4*   Liver Function Tests: Recent Labs  Lab 03/07/20 0204  AST 17  ALT 12  ALKPHOS 69  BILITOT 0.4  PROT 7.4  ALBUMIN 2.4*   Coagulation Profile: No results for input(s): INR, PROTIME in the last 168 hours. HbA1C: No results for input(s): HGBA1C in the last 72 hours. CBG: Recent Labs  Lab 03/08/20 1205 03/08/20 1656 03/08/20 2041 03/09/20 0815 03/09/20 1152  GLUCAP 173* 149* 183* 100* 157*    No results found for this or any previous visit (from the past 240 hour(s)).   Radiology Studies: No results found.   05/07/20, MD, PhD [facility]  Between 7 am - 7 pm I am available, please contact me via Amion or Securechat  Between 7 pm - 7 am I am not available, please contact night coverage MD/APP via Amion

## 2020-03-10 LAB — GLUCOSE, CAPILLARY
Glucose-Capillary: 107 mg/dL — ABNORMAL HIGH (ref 70–99)
Glucose-Capillary: 146 mg/dL — ABNORMAL HIGH (ref 70–99)
Glucose-Capillary: 202 mg/dL — ABNORMAL HIGH (ref 70–99)

## 2020-03-10 MED ORDER — FERROUS GLUCONATE 324 (38 FE) MG PO TABS: 324.0000 mg | ORAL_TABLET | Freq: Three times a day (TID) | ORAL | 3 refills | Status: AC

## 2020-03-10 NOTE — TOC Progression Note (Signed)
Transition of Care [facility]) - Progression Note    Patient Details  Name: Ruth Reynolds MRN: [id number] Date of Birth: [birth year]  [facility]) CM/SW Contact  Lorri Frederick, LCSW Phone Number: 03/10/2020, 3:43 PM  Clinical Narrative: CSW spoke with Mariella Saa at [facility] who extends bed offer for LTC and SNF for this pt.  CSW spoke with pt niece Bernadene Person about this offer and she is in agreement with this plan.  CSW spoke with pt who also is in agreement with transfer to this facility.  Discussed insurance situation with Mariella Saa, no [employer] medicare, active medicaid and [employer].  She submitted auth, which was approved. MD notified and pt will transfer today.       Expected Discharge Plan: Skilled Nursing Facility Barriers to Discharge: Barriers Resolved  Expected Discharge Plan and Services Expected Discharge Plan: Skilled Nursing Facility In-house Referral: NA Discharge Planning Services: CM Consult   Living arrangements for the past 2 months: Single Family Home Expected Discharge Date: 03/10/20               DME Arranged: N/A DME Agency: NA                   Social Determinants of Health (SDOH) Interventions    Readmission Risk Interventions No flowsheet data found.

## 2020-03-10 NOTE — TOC Transition Note (Signed)
Transition of Care [facility]) - CM/SW Discharge Note   Patient Details  Name: Ruth Reynolds MRN: [id number] Date of Birth: [birth year]  Transition of [facility]) CM/SW Contact:  Lorri Frederick, LCSW Phone Number: 03/10/2020, 3:43 PM   Clinical Narrative:   Pt discharging to [facility], Room 122B.  RN call report to [phone].     Final next level of care: Skilled Nursing Facility Barriers to Discharge: Barriers Resolved   Patient Goals and CMS Choice Patient states their goals for this hospitalization and ongoing recovery are:: Wants to go home soon      Discharge Placement              Patient chooses bed at:  ([facility]) Patient to be transferred to facility by: [facility] Name of family member notified: Rutherford Guys, niece Patient and family notified of of transfer: 03/10/20  Discharge Plan and Services In-house Referral: NA Discharge Planning Services: CM Consult            DME Arranged: N/A DME Agency: NA                  Social Determinants of Health (SDOH) Interventions     Readmission Risk Interventions No flowsheet data found.

## 2020-03-10 NOTE — Progress Notes (Deleted)
PROGRESS NOTE  Ruth Reynolds SVX:[id number] DOB: [birth year] DOA: 02/23/2020 PCP: Patient, No Pcp Per   LOS: 15 days   Brief Narrative / Interim history: 70 year old female with DM 2, chronic anemia, prior SDH, chronic diastolic CHF came into the hospital via EMS after being found on the floor.  Patient had a fall the day prior to admission has been on the floor for roughly 24 hours.  Currently lives with her niece.  There are reports of generalized weakness and increased fatigue.  She was found to be Covid positive, had no significant respiratory symptoms, but presented with metabolic encephalopathy.  She was also found to have a stroke and new cardiomyopathy, and placed on aspirin and Plavix for 3 weeks then aspirin alone.  Subjective / 24h Interval events: Denies any complaints  Assessment & Plan: Principal Problem COVID-19 infection-due to high risk, she received 3 days of Remdesivir.  She was positive for Covid on 1/23, off quarantine on 2/3.  No respiratory involvement  Active Problems Urinary tract infection-status post ciprofloxacin.  Urine cultures are negative Acute/subacute stroke, acute metabolic encephalopathy - MRI showed stroke, 7 mm focus diffusion abnormality in the right paramedial pons consistent with acute to subacute infarct.  Multiple remote lacunar infarcts.  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  Neurology consulted, currently on aspirin and Plavix for 3 weeks and aspirin alone.  She is on a statin.  PT recommended SNF and placement is pending Iron deficiency anemia-hemoglobin dipped to 7.3 on 2/4, she refused transfusion.  Closely monitor, overall stable, no bleeding Chronic systolic CHF-echo showed an EF of 45-50%, continue beta-blockers, ARB Hypertension -continue medications as below  Scheduled Meds: . vitamin C  500 mg Oral Daily  . aspirin EC  81  mg Oral Daily  . atorvastatin  40 mg Oral QPM  . clopidogrel  75 mg Oral Daily  . ferrous gluconate  324 mg Oral TID WC  . folic acid  1 mg Oral Daily  . hydrALAZINE  25 mg Oral BID  . insulin aspart  0-15 Units Subcutaneous TID WC  . linagliptin  5 mg Oral Daily  . losartan  25 mg Oral Daily  . metoprolol succinate  25 mg Oral Daily  . multivitamin with minerals  1 tablet Oral Daily  . senna  1 tablet Oral BID  . vitamin B-12  100 mcg Oral Daily  . zinc sulfate  220 mg Oral Daily   Continuous Infusions: PRN Meds:.acetaminophen, chlorpheniramine-HYDROcodone, guaiFENesin-dextromethorphan, Ipratropium-Albuterol, traMADol  Diet Orders (From admission, onward)    Start     Ordered   03/03/20 0000  Diet - low sodium heart healthy        03/03/20 1611   02/25/20 1727  Diet Carb Modified Fluid consistency: Thin; Room service appropriate? Yes  Diet effective now       Comments: Patient doesn't eat Pork.  Question Answer Comment  Diet-HS Snack? Nothing   Calorie Level Medium 1600-2000   Fluid consistency: Thin   Room service appropriate? Yes      02/25/20 1726          DVT prophylaxis:      Code Status: Full Code  Family Communication: no family at bedside, niece over the phone 2/7  Status is: Inpatient  Remains inpatient appropriate because:Unsafe d/c plan   Dispo: The patient is from: Home  Anticipated d/c is to: SNF              Anticipated d/c date is: 1 day              Patient currently is medically stable to d/c.   Difficult to place patient Yes  Level of care: Med-Surg  Consultants:  none  Procedures:  None   Microbiology  None   Antimicrobials: None     Objective: Vitals:   03/09/20 0800 03/09/20 2010 03/10/20 0001 03/10/20 0421  BP: (!) 151/72 (!) 127/97 126/74 134/76  Pulse: 66 90 85 70  Resp: 18 18 18 20   Temp: 97.9 F (36.6 C) 98 F (36.7 C) 98.1 F (36.7 C) 97.9 F (36.6 C)  TempSrc: Oral Oral Oral Oral  SpO2: 100% 100%  95% 100%  Weight:      Height:       No intake or output data in the 24 hours ending 03/10/20 1033 Filed Weights   02/26/20 0800  Weight: 81.6 kg    Examination:  Constitutional: No distress Respiratory: Lungs are clear Cardiovascular: Regular rate and rhythm, trace edema   Data Reviewed: I have independently reviewed following labs and imaging studies   CBC: Recent Labs  Lab 03/05/20 0245 03/06/20 0230 03/07/20 0204 03/09/20 0204  WBC 8.5 9.3 10.5 6.6  HGB 8.0* 7.3* 7.9* 7.4*  HCT 25.9* 22.9* 24.2* 22.8*  MCV 76.4* 74.8* 75.4* 75.2*  PLT 539* 444* 469* 396   Basic Metabolic Panel: Recent Labs  Lab 03/05/20 0245 03/06/20 0230 03/07/20 0204 03/09/20 0204  NA 138 138 136 137  K 4.3 4.1 4.4 4.2  CL 106 106 103 106  CO2 21* 21* 22 23  GLUCOSE 124* 118* 163* 132*  BUN 25* 24* 24* 20  CREATININE 1.17* 0.92 1.07* 0.76  CALCIUM 8.5* 8.4* 8.7* 8.4*   Liver Function Tests: Recent Labs  Lab 03/07/20 0204  AST 17  ALT 12  ALKPHOS 69  BILITOT 0.4  PROT 7.4  ALBUMIN 2.4*   Coagulation Profile: No results for input(s): INR, PROTIME in the last 168 hours. HbA1C: No results for input(s): HGBA1C in the last 72 hours. CBG: Recent Labs  Lab 03/09/20 1152 03/09/20 1711 03/09/20 2105 03/09/20 2149 03/10/20 0829  GLUCAP 157* 152* 218* 237* 107*    No results found for this or any previous visit (from the past 240 hour(s)).   Radiology Studies: No results found.   05/08/20, MD, PhD [facility]  Between 7 am - 7 pm I am available, please contact me via Amion or Securechat  Between 7 pm - 7 am I am not available, please contact night coverage MD/APP via Amion

## 2020-03-10 NOTE — Progress Notes (Signed)
Report called to [facility]. Pt is to be discharged via [facility]. Niece is aware of transfer.

## 2020-03-10 NOTE — Discharge Summary (Signed)
Physician Discharge Summary  Ruth Reynolds JWJ:[id number] DOB: 04/16/1950 DOA: 02/23/2020  PCP: Patient, No Pcp Per  Admit date: 02/23/2020 Discharge date: 03/10/2020  Admitted From: home Disposition:  SNF  Recommendations for Outpatient Follow-up:  1. Follow up with PCP in 1-2 weeks 2. Please obtain BMP/CBC in one week  Home Health: none Equipment/Devices: none  Discharge Condition: stable CODE STATUS: Full code Diet recommendation: regular  HPI: Per admitting MD, Ruth Reynolds is a 70 y.o. female with medical history significant of DM2, chronic anemia, h/o SDH after fall, HFpEF. She presents to the ED via EMS after being found on the floor. Patient states she was sleeping on the floor because her bed was filled with magazines and she felt like that was the best place to fall asleep. Spoke to patient's niece who notes that patient fell yesterday and has been on the floor for roughly 24 hours. She currently lives with her niece who notes she attempted to help patient up however, patient did not want any help and continued tolay on the floor for roughly 1 day. Niece reports generalized weakness over the past few weeks with increased fatigue. He is also notes that patient has been urinating on herself recently due to the inability to get to the bathroom quick enough. Over the past few months she has been ambulating with a cane due to "fluid in her foot". No fever or chills. Patient denies chest pain, shortness of breath, abdominal pain, nausea, vomiting, diarrhea.   Hospital Course / Discharge diagnoses: 70 year old female with DM 2, chronic anemia, prior SDH, chronic diastolic CHF came into the hospital via EMS after being found on the floor.  Patient had a fall the day prior to admission has been on the floor for roughly 24 hours.  Currently lives with her niece.  There are reports of generalized weakness and increased fatigue.  She was found to be Covid positive, had no significant respiratory  symptoms, but presented with metabolic encephalopathy.  She was also found to have a stroke and new cardiomyopathy, and placed on aspirin and Plavix for 3 weeks then aspirin alone.  Principal Problem COVID-19 infection-due to high risk, she received 3 days of Remdesivir.  She was positive for Covid on 1/23, off quarantine on 2/3.  No respiratory involvement  Active Problems Urinary tract infection-status post ciprofloxacin.  Urine cultures are negative Acute/subacute stroke, acute metabolic encephalopathy - MRI showed stroke, 7 mm focus diffusion abnormality in the right paramedial pons consistent with acute to subacute infarct.  Multiple remote lacunar infarcts.  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  Neurology consulted, currently on aspirin and Plavix for 3 weeks and aspirin alone.  She is on a statin.  Iron deficiency anemia-hemoglobin dipped to 7.3 on 2/4, she refused transfusion.  Closely monitor, overall stable, no bleeding. Continue iron Chronic systolic CHF-echo showed an EF of 45-50%, continue beta-blockers, ARB Hypertension -continue medications as below   Sepsis ruled out   Discharge Instructions  Discharge Instructions    Ambulatory referral to Neurology   Complete by: As directed    Follow up with stroke clinic NP (Jessica Vanschaick or Darrol Angelarolyn Martin, if both not available, consider Manson AllanSethi, Penumali, or Ahern) [facility] in about 4 weeks. Thanks.   Diet - low sodium heart healthy   Complete by: As directed    Increase activity slowly  Complete by: As directed      Allergies as of 03/10/2020   No Known Allergies     Medication List    STOP taking these medications   lisinopril-hydrochlorothiazide 20-12.5 MG tablet Commonly known as: ZESTORETIC     TAKE these medications   ascorbic acid 500 MG tablet Commonly known as: VITAMIN C Take 1 tablet (500  mg total) by mouth daily.   aspirin 81 MG EC tablet Take 1 tablet (81 mg total) by mouth daily. Swallow whole.   atorvastatin 40 MG tablet Commonly known as: LIPITOR Take 1 tablet (40 mg total) by mouth every evening.   clopidogrel 75 MG tablet Commonly known as: PLAVIX Take 1 tablet (75 mg total) by mouth daily for 21 days.   cyanocobalamin 100 MCG tablet Take 1 tablet (100 mcg total) by mouth daily.   ferrous gluconate 324 MG tablet Commonly known as: FERGON Take 1 tablet (324 mg total) by mouth 3 (three) times daily with meals.   folic acid 1 MG tablet Commonly known as: FOLVITE Take 1 tablet (1 mg total) by mouth daily.   hydrALAZINE 25 MG tablet Commonly known as: APRESOLINE Take 1 tablet (25 mg total) by mouth 2 (two) times daily.   linagliptin 5 MG Tabs tablet Commonly known as: TRADJENTA Take 1 tablet (5 mg total) by mouth daily.   losartan 25 MG tablet Commonly known as: COZAAR Take 1 tablet (25 mg total) by mouth daily.   metoprolol succinate 25 MG 24 hr tablet Commonly known as: TOPROL-XL Take 1 tablet (25 mg total) by mouth daily.   multivitamin with minerals Tabs tablet Take 1 tablet by mouth daily.   senna 8.6 MG Tabs tablet Commonly known as: SENOKOT Take 1 tablet (8.6 mg total) by mouth 2 (two) times daily.   zinc sulfate 220 (50 Zn) MG capsule Take 1 capsule (220 mg total) by mouth daily.       Follow-up Information    [facility] Follow up on 03/24/2020.   Specialty: Internal Medicine Why: @2 :30.  Please take a list of medications and arrive 15 minutes early to complete paperwork. Contact information: [address] [id number] [address] [place] [address] [phone]       Quintella Reichert, MD Follow up.   Specialty: Cardiology Why: New Patient Visit with Cardiology scheduled for 03/24/2020 at 9:40am. Please arrive 15 minutes early for check-in. If this date/time does not work for you, please  call our office to reschedule. Contact information: [address] [place] [address] [phone]        [facility]. Schedule an appointment as soon as possible for a visit in 4 week(s).   Specialty: Neurology Contact information: [address] [place] [address] [phone]              Consultations:  Neurology   Procedures/Studies:  CT Code Stroke CTA Head W/WO contrast  Result Date: 02/26/2020 CLINICAL DATA:  Stroke. EXAM: CT ANGIOGRAPHY HEAD AND NECK TECHNIQUE: Multidetector CT imaging of the head and neck was performed using the standard protocol during bolus administration of intravenous contrast. Multiplanar CT image reconstructions and MIPs were obtained to evaluate the vascular anatomy. Carotid stenosis measurements (when applicable) are obtained utilizing NASCET criteria, using the distal internal carotid diameter as the denominator. CONTRAST:  75mL OMNIPAQUE IOHEXOL 350 MG/ML SOLN COMPARISON:  Head MRI 02/25/2020 FINDINGS: CT HEAD FINDINGS Brain: The subcentimeter acute to early subacute  right pontine infarct on MRI is not visible by CT. No acute cortically based infarct, intracranial hemorrhage, mass, midline shift, or extra-axial fluid collection is identified. Confluent hypodensities in the cerebral white matter bilaterally are nonspecific but compatible with severe chronic small vessel ischemic disease. There are chronic lacunar infarcts in the thalami. Vascular: Calcified atherosclerosis at the skull base. Skull: No fracture or suspicious osseous lesion. Sinuses: Mild scattered mucosal thickening in the paranasal sinuses. Clear mastoid air cells. Orbits: Unremarkable. Review of the MIP images confirms the above findings CTA NECK FINDINGS Aortic arch: Standard 3 vessel aortic arch with mild atherosclerotic plaque. No arch vessel origin stenosis. Right carotid system: Patent with mild calcified and soft  plaque at the carotid bifurcation. No evidence of significant stenosis or dissection. Tortuous mid cervical ICA. Beading of the mid and distal cervical ICA. Left carotid system: Patent with mild calcified and soft plaque at the carotid bifurcation. No evidence of significant stenosis or dissection. Tortuous mid cervical ICA. Beading of the mid and distal cervical ICA. 4 mm laterally projecting outpouching from the ICA just below the skull base suggestive of a small pseudoaneurysm. Vertebral arteries: Patent without evidence of significant stenosis or dissection. Strongly dominant left vertebral artery. Skeleton: Advanced disc degeneration from C4-5 to C6-7. Other neck: No evidence of cervical lymphadenopathy or mass. Upper chest: Partially visualized small area of peripheral ground-glass opacity laterally in the right upper lobe. Review of the MIP images confirms the above findings CTA HEAD FINDINGS Anterior circulation: The internal carotid arteries are patent from skull base to carotid termini with mild atherosclerotic plaque bilaterally not resulting in significant stenosis. There is a 2 mm aneurysm projecting inferiorly from the left supraclinoid ICA in the posterior communicating region. ACAs and MCAs are patent without evidence of a proximal branch occlusion. There is mild irregularity of the M1 segments and MCA branch vessels bilaterally without a flow limiting proximal stenosis, and there is moderate irregular narrowing of distal ACA branch vessels. Posterior circulation: The intracranial vertebral arteries are patent to the basilar with atherosclerotic plaque on the left resulting in luminal irregularity but no significant stenosis. The basilar artery is widely patent. Posterior communicating arteries are diminutive or absent. The PCAs are patent with mild distal branch vessel irregularity bilaterally as well as mild multifocal stenosis of the left P2 segment. No aneurysm is identified. Venous sinuses: As  permitted by contrast timing, patent. Anatomic variants: None. Review of the MIP images confirms the above findings IMPRESSION: 1. Intracranial atherosclerosis without a medium or large vessel occlusion or flow limiting proximal stenosis. 2. 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 4. Partially visualized small area of pulmonary ground-glass opacity laterally in the right upper lobe, nonspecific and incompletely evaluated though likely reflecting known COVID-19 infection. 5. Aortic Atherosclerosis (ICD10-I70.0). Electronically Signed   By: Sebastian Ache M.D.   On: 02/26/2020 21:10   CT Head Wo Contrast  Result Date: 02/23/2020 CLINICAL DATA:  Neck trauma. EXAM: CT HEAD WITHOUT CONTRAST CT CERVICAL SPINE WITHOUT CONTRAST TECHNIQUE: Multidetector CT imaging of the head and cervical spine was performed following the standard protocol without intravenous contrast. Multiplanar CT image reconstructions of the cervical spine were also generated. COMPARISON:  None. FINDINGS: CT HEAD FINDINGS Brain: No evidence of acute infarction, hemorrhage, hydrocephalus, extra-axial collection or mass lesion/mass effect. There is chronic diffuse atrophy. Chronic bilateral periventricular white matter small vessel  ischemic changes noted. Vascular: No hyperdense vessel is noted. Skull: Normal. Negative for fracture or focal lesion. Sinuses/Orbits: No acute finding. Other: None. CT CERVICAL SPINE FINDINGS Alignment: Normal. Skull base and vertebrae: No acute fracture. No primary bone lesion or focal pathologic process. Soft tissues and spinal canal: No prevertebral fluid or swelling. No visible canal hematoma. Disc levels: Degenerative joint changes with narrowed joint space and osteophyte formation identified mid to lower cervical spine. Upper chest: Negative. Other: None. IMPRESSION:  1. No focal acute intracranial abnormality identified. 2. Chronic diffuse atrophy and chronic bilateral periventricular white matter small vessel ischemic change. 3. No acute fracture or dislocation of cervical spine. 4. Degenerative joint changes of cervical spine. Electronically Signed   By: Sherian Rein M.D.   On: 02/23/2020 08:44   CT Code Stroke CTA Neck W/WO contrast  Result Date: 02/26/2020 CLINICAL DATA:  Stroke. EXAM: CT ANGIOGRAPHY HEAD AND NECK TECHNIQUE: Multidetector CT imaging of the head and neck was performed using the standard protocol during bolus administration of intravenous contrast. Multiplanar CT image reconstructions and MIPs were obtained to evaluate the vascular anatomy. Carotid stenosis measurements (when applicable) are obtained utilizing NASCET criteria, using the distal internal carotid diameter as the denominator. CONTRAST:  23mL OMNIPAQUE IOHEXOL 350 MG/ML SOLN COMPARISON:  Head MRI 02/25/2020 FINDINGS: CT HEAD FINDINGS Brain: The subcentimeter acute to early subacute right pontine infarct on MRI is not visible by CT. No acute cortically based infarct, intracranial hemorrhage, mass, midline shift, or extra-axial fluid collection is identified. Confluent hypodensities in the cerebral white matter bilaterally are nonspecific but compatible with severe chronic small vessel ischemic disease. There are chronic lacunar infarcts in the thalami. Vascular: Calcified atherosclerosis at the skull base. Skull: No fracture or suspicious osseous lesion. Sinuses: Mild scattered mucosal thickening in the paranasal sinuses. Clear mastoid air cells. Orbits: Unremarkable. Review of the MIP images confirms the above findings CTA NECK FINDINGS Aortic arch: Standard 3 vessel aortic arch with mild atherosclerotic plaque. No arch vessel origin stenosis. Right carotid system: Patent with mild calcified and soft plaque at the carotid bifurcation. No evidence of significant stenosis or dissection. Tortuous  mid cervical ICA. Beading of the mid and distal cervical ICA. Left carotid system: Patent with mild calcified and soft plaque at the carotid bifurcation. No evidence of significant stenosis or dissection. Tortuous mid cervical ICA. Beading of the mid and distal cervical ICA. 4 mm laterally projecting outpouching from the ICA just below the skull base suggestive of a small pseudoaneurysm. Vertebral arteries: Patent without evidence of significant stenosis or dissection. Strongly dominant left vertebral artery. Skeleton: Advanced disc degeneration from C4-5 to C6-7. Other neck: No evidence of cervical lymphadenopathy or mass. Upper chest: Partially visualized small area of peripheral ground-glass opacity laterally in the right upper lobe. Review of the MIP images confirms the above findings CTA HEAD FINDINGS Anterior circulation: The internal carotid arteries are patent from skull base to carotid termini with mild atherosclerotic plaque bilaterally not resulting in significant stenosis. There is a 2 mm aneurysm projecting inferiorly from the left supraclinoid ICA in the posterior communicating region. ACAs and MCAs are patent without evidence of a proximal branch occlusion. There is mild irregularity of the M1 segments and MCA branch vessels bilaterally without a flow limiting proximal stenosis, and there is moderate irregular narrowing of distal ACA branch vessels. Posterior circulation: The intracranial vertebral arteries are patent to the basilar with atherosclerotic plaque on the left resulting in luminal irregularity but no significant stenosis. The basilar  artery is widely patent. Posterior communicating arteries are diminutive or absent. The PCAs are patent with mild distal branch vessel irregularity bilaterally as well as mild multifocal stenosis of the left P2 segment. No aneurysm is identified. Venous sinuses: As permitted by contrast timing, patent. Anatomic variants: None. Review of the MIP images confirms  the above findings IMPRESSION: 1. Intracranial atherosclerosis without a medium or large vessel occlusion or flow limiting proximal stenosis. 2. 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 4. Partially visualized small area of pulmonary ground-glass opacity laterally in the right upper lobe, nonspecific and incompletely evaluated though likely reflecting known COVID-19 infection. 5. Aortic Atherosclerosis (ICD10-I70.0). Electronically Signed   By: Sebastian Ache M.D.   On: 02/26/2020 21:10   CT Cervical Spine Wo Contrast  Result Date: 02/23/2020 CLINICAL DATA:  Neck trauma. EXAM: CT HEAD WITHOUT CONTRAST CT CERVICAL SPINE WITHOUT CONTRAST TECHNIQUE: Multidetector CT imaging of the head and cervical spine was performed following the standard protocol without intravenous contrast. Multiplanar CT image reconstructions of the cervical spine were also generated. COMPARISON:  None. FINDINGS: CT HEAD FINDINGS Brain: No evidence of acute infarction, hemorrhage, hydrocephalus, extra-axial collection or mass lesion/mass effect. There is chronic diffuse atrophy. Chronic bilateral periventricular white matter small vessel ischemic changes noted. Vascular: No hyperdense vessel is noted. Skull: Normal. Negative for fracture or focal lesion. Sinuses/Orbits: No acute finding. Other: None. CT CERVICAL SPINE FINDINGS Alignment: Normal. Skull base and vertebrae: No acute fracture. No primary bone lesion or focal pathologic process. Soft tissues and spinal canal: No prevertebral fluid or swelling. No visible canal hematoma. Disc levels: Degenerative joint changes with narrowed joint space and osteophyte formation identified mid to lower cervical spine. Upper chest: Negative. Other: None. IMPRESSION: 1. No focal acute intracranial abnormality identified. 2. Chronic diffuse atrophy and  chronic bilateral periventricular white matter small vessel ischemic change. 3. No acute fracture or dislocation of cervical spine. 4. Degenerative joint changes of cervical spine. Electronically Signed   By: Sherian Rein M.D.   On: 02/23/2020 08:44   MR BRAIN WO CONTRAST  Result Date: 02/26/2020 CLINICAL DATA:  Initial evaluation for acute delirium, found down. EXAM: MRI HEAD WITHOUT CONTRAST TECHNIQUE: Multiplanar, multiecho pulse sequences of the brain and surrounding structures were obtained without intravenous contrast. COMPARISON:  Prior CT from 02/23/2020. FINDINGS: Brain: Diffuse prominence of the CSF containing spaces compatible with generalized age-related cerebral atrophy. Extensive patchy and confluent T2/FLAIR hyperintensity throughout the periventricular and deep white matter both cerebral hemispheres as well as the pons total most consistent with chronic microvascular ischemic disease, advanced in nature. Multiple superimposed remote lacunar infarcts present about the periventricular white matter of the corona radiata as well as the deep gray nuclei. 7 mm focus of mild diffusion abnormality seen involving the right paramedian pons, consistent with an acute to subacute small vessel type infarct (series 5, image 61). No associated hemorrhage or mass effect. No other foci of diffusion abnormality to suggest acute or subacute ischemia. Gray-white matter differentiation otherwise maintained. No encephalomalacia to suggest chronic cortical infarction elsewhere within the brain. No other foci of susceptibility artifact to suggest acute or chronic intracranial hemorrhage. No mass lesion, midline shift or mass effect. Mild diffuse ventricular prominence related to global parenchymal volume loss without hydrocephalus. No made of a partially empty sella. No extra-axial fluid collection. Midline structures intact. Vascular: Major intracranial vascular flow voids are  maintained. Strongly dominant left  vertebral artery noted. Skull and upper cervical spine: Craniocervical junction within normal limits. Upper cervical spine demonstrates no acute finding. Bone marrow signal intensity within normal limits. Scalp soft tissues unremarkable. Sinuses/Orbits: Globes and orbital soft tissues within normal limits. Mild-to-moderate mucosal thickening noted throughout the paranasal sinuses, likely allergic/inflammatory nature. Mastoid air cells are largely clear. Other: None. IMPRESSION: 1. 7 mm focus of diffusion abnormality involving the right paramedian pons, consistent with an acute to early subacute small vessel type infarct. No associated hemorrhage or mass effect. 2. No other acute intracranial abnormality. 3. Age-related cerebral atrophy with advanced chronic microvascular ischemic disease, with multiple superimposed remote lacunar infarcts involving the hemispheric cerebral white matter and deep gray nuclei. Electronically Signed   By: Rise Mu M.D.   On: 02/26/2020 00:24   DG Chest Port 1 View  Result Date: 02/24/2020 CLINICAL DATA:  COVID-19 positive.  Weakness. EXAM: PORTABLE CHEST 1 VIEW COMPARISON:  None. FINDINGS: There is a subtle area of airspace opacity in the lateral right base region. Lungs elsewhere clear. Heart is borderline enlarged with pulmonary vascularity normal. No adenopathy. There is aortic atherosclerosis. No bone lesions. IMPRESSION: Subtle airspace opacity lateral right base concerning for focus of developing pneumonia. Atypical organism pneumonia could present in this manner. Lungs otherwise clear. Heart borderline enlarged. Aortic Atherosclerosis (ICD10-I70.0). Electronically Signed   By: Bretta Bang III M.D.   On: 02/24/2020 10:31   ECHOCARDIOGRAM COMPLETE  Result Date: 02/24/2020    ECHOCARDIOGRAM REPORT   Patient Name:   Ruth Reynolds Date of Exam: 02/24/2020 Medical Rec #:  [id number]   Height:       66.0 in Accession #:    [id number]  Weight:       180.0 lb Date of  Birth:  Jan 10, 1951    BSA:          1.912 m Patient Age:    69 years    BP:           122/62 mmHg Patient Gender: F           HR:           85 bpm. Exam Location:  Inpatient Procedure: 2D Echo, 3D Echo, Cardiac Doppler and Color Doppler Indications:    I50.9* Heart failure (unspecified)  History:        Patient has prior history of Echocardiogram examinations, most                 recent 11/12/2018. Stroke, Signs/Symptoms:Syncope; Risk                 Factors:Diabetes. Covid positive.  Sonographer:    Sheralyn Boatman RDCS Referring Phys: [id number] MICHAEL E NORINS  Sonographer Comments: Technically difficult study due to poor echo windows. IMPRESSIONS  1. Left ventricular ejection fraction, by estimation, is 45 to 50%. The left ventricle has mildly decreased function. The left ventricle demonstrates global hypokinesis. There is moderate concentric left ventricular hypertrophy. Left ventricular diastolic parameters are consistent with Grade I diastolic dysfunction (impaired relaxation).  2. Right ventricular systolic function is normal. The right ventricular size is normal. There is normal pulmonary artery systolic pressure.  3. The mitral valve is degenerative. Mild mitral valve regurgitation.  4. The aortic valve is tricuspid. Aortic valve regurgitation is mild.  5. Aortic dilatation noted. There is mild dilatation of the ascending aorta, measuring 38 mm.  6. The inferior vena cava is normal in size with greater than 50% respiratory variability, suggesting  right atrial pressure of 3 mmHg. Comparison(s): Compared to prior echo report (images would not load) in 2016, the LVEF is now mildly reduced at 45-50%. FINDINGS  Left Ventricle: Left ventricular ejection fraction, by estimation, is 45 to 50%. The left ventricle has mildly decreased function. The left ventricle demonstrates global hypokinesis. The left ventricular internal cavity size was normal in size. There is  moderate concentric left ventricular hypertrophy. Left  ventricular diastolic parameters are consistent with Grade I diastolic dysfunction (impaired relaxation). Right Ventricle: The right ventricular size is normal. No increase in right ventricular wall thickness. Right ventricular systolic function is normal. There is normal pulmonary artery systolic pressure. The tricuspid regurgitant velocity is 2.57 m/s, and  with an assumed right atrial pressure of 8 mmHg, the estimated right ventricular systolic pressure is 34.4 mmHg. Left Atrium: Left atrial size was normal in size. Right Atrium: Right atrial size was normal in size. Pericardium: There is no evidence of pericardial effusion. Mitral Valve: The mitral valve is degenerative in appearance. There is mild thickening of the mitral valve leaflet(s). There is mild calcification of the mitral valve leaflet(s). Mild mitral annular calcification. Mild mitral valve regurgitation. Tricuspid Valve: The tricuspid valve is normal in structure. Tricuspid valve regurgitation is mild. Aortic Valve: The aortic valve is tricuspid. Aortic valve regurgitation is mild. Aortic regurgitation PHT measures 696 msec. Pulmonic Valve: The pulmonic valve was normal in structure. Pulmonic valve regurgitation is mild. Aorta: Aortic dilatation noted. There is mild dilatation of the ascending aorta, measuring 38 mm. Venous: The inferior vena cava is normal in size with greater than 50% respiratory variability, suggesting right atrial pressure of 3 mmHg. IAS/Shunts: No atrial level shunt detected by color flow Doppler.  LEFT VENTRICLE PLAX 2D LVIDd:         3.95 cm     Diastology LVIDs:         3.35 cm     LV e' medial:    3.81 cm/s LV PW:         2.15 cm     LV E/e' medial:  11.7 LV IVS:        1.50 cm     LV e' lateral:   6.36 cm/s LVOT diam:     2.30 cm     LV E/e' lateral: 7.0 LV SV:         106 LV SV Index:   55 LVOT Area:     4.15 cm  LV Volumes (MOD) LV vol d, MOD A2C: 58.5 ml LV vol d, MOD A4C: 78.2 ml LV vol s, MOD A2C: 34.5 ml LV vol s,  MOD A4C: 50.9 ml LV SV MOD A2C:     24.0 ml LV SV MOD A4C:     78.2 ml LV SV MOD BP:      26.6 ml IVC IVC diam: 1.50 cm LEFT ATRIUM             Index       RIGHT ATRIUM           Index LA diam:        1.80 cm 0.94 cm/m  RA Area:     12.90 cm LA Vol (A2C):   66.4 ml 34.72 ml/m RA Volume:   27.30 ml  14.28 ml/m LA Vol (A4C):   53.1 ml 27.77 ml/m LA Biplane Vol: 59.9 ml 31.32 ml/m  AORTIC VALVE             PULMONIC VALVE LVOT Vmax:  123.00 cm/s PR End Diast Vel: 1.50 msec LVOT Vmean:  82.500 cm/s LVOT VTI:    0.254 m AI PHT:      696 msec  AORTA Ao Root diam: 4.15 cm Ao Asc diam:  3.80 cm MITRAL VALVE                TRICUSPID VALVE MV Area (PHT): 4.89 cm     TR Peak grad:   26.4 mmHg MV Decel Time: 155 msec     TR Vmax:        257.00 cm/s MV E velocity: 44.70 cm/s MV A velocity: 108.00 cm/s  SHUNTS MV E/A ratio:  0.41         Systemic VTI:  0.25 m                             Systemic Diam: 2.30 cm Laurance Flatten MD Electronically signed by Laurance Flatten MD Signature Date/Time: 02/24/2020/4:23:19 PM    Final       Subjective: - no chest pain, shortness of breath, no abdominal pain, nausea or vomiting.   Discharge Exam: BP (!) 127/94 (BP Location: Right Arm)   Pulse 94   Temp 98.9 F (37.2 C) (Oral)   Resp 18   Ht 5\' 6"  (1.676 m)   Wt 81.6 kg   SpO2 100%   BMI 29.04 kg/m   General: Pt is alert, awake, not in acute distress Cardiovascular: RRR, S1/S2 +, no rubs, no gallops Respiratory: CTA bilaterally, no wheezing, no rhonchi Abdominal: Soft, NT, ND, bowel sounds + Extremities: no edema, no cyanosis    The results of significant diagnostics from this hospitalization (including imaging, microbiology, ancillary and laboratory) are listed below for reference.     Microbiology: No results found for this or any previous visit (from the past 240 hour(s)).   Labs: Basic Metabolic Panel: Recent Labs  Lab 03/05/20 0245 03/06/20 0230 03/07/20 0204 03/09/20 0204  NA 138 138 136  137  K 4.3 4.1 4.4 4.2  CL 106 106 103 106  CO2 21* 21* 22 23  GLUCOSE 124* 118* 163* 132*  BUN 25* 24* 24* 20  CREATININE 1.17* 0.92 1.07* 0.76  CALCIUM 8.5* 8.4* 8.7* 8.4*   Liver Function Tests: Recent Labs  Lab 03/07/20 0204  AST 17  ALT 12  ALKPHOS 69  BILITOT 0.4  PROT 7.4  ALBUMIN 2.4*   CBC: Recent Labs  Lab 03/05/20 0245 03/06/20 0230 03/07/20 0204 03/09/20 0204  WBC 8.5 9.3 10.5 6.6  HGB 8.0* 7.3* 7.9* 7.4*  HCT 25.9* 22.9* 24.2* 22.8*  MCV 76.4* 74.8* 75.4* 75.2*  PLT 539* 444* 469* 396   CBG: Recent Labs  Lab 03/09/20 1711 03/09/20 2105 03/09/20 2149 03/10/20 0829 03/10/20 1204  GLUCAP 152* 218* 237* 107* 146*   Hgb A1c No results for input(s): HGBA1C in the last 72 hours. Lipid Profile No results for input(s): CHOL, HDL, LDLCALC, TRIG, CHOLHDL, LDLDIRECT in the last 72 hours. Thyroid function studies No results for input(s): TSH, T4TOTAL, T3FREE, THYROIDAB in the last 72 hours.  Invalid input(s): FREET3 Urinalysis    Component Value Date/Time   COLORURINE YELLOW 02/23/2020 0955   APPEARANCEUR HAZY (A) 02/23/2020 0955   LABSPEC 1.020 02/23/2020 0955   PHURINE 5.0 02/23/2020 0955   GLUCOSEU >=500 (A) 02/23/2020 0955   HGBUR NEGATIVE 02/23/2020 0955   BILIRUBINUR NEGATIVE 02/23/2020 0955   KETONESUR NEGATIVE 02/23/2020 0955   PROTEINUR 100 (  A) 02/23/2020 0955   NITRITE NEGATIVE 02/23/2020 0955   LEUKOCYTESUR SMALL (A) 02/23/2020 0955    FURTHER DISCHARGE INSTRUCTIONS:   Get Medicines reviewed and adjusted: Please take all your medications with you for your next visit with your Primary MD   Laboratory/radiological data: Please request your Primary MD to go over all hospital tests and procedure/radiological results at the follow up, please ask your Primary MD to get all Hospital records sent to his/her office.   In some cases, they will be blood work, cultures and biopsy results pending at the time of your discharge. Please request  that your primary care M.D. goes through all the records of your hospital data and follows up on these results.   Also Note the following: If you experience worsening of your admission symptoms, develop shortness of breath, life threatening emergency, suicidal or homicidal thoughts you must seek medical attention immediately by calling 911 or calling your MD immediately  if symptoms less severe.   You must read complete instructions/literature along with all the possible adverse reactions/side effects for all the Medicines you take and that have been prescribed to you. Take any new Medicines after you have completely understood and accpet all the possible adverse reactions/side effects.    Do not drive when taking Pain medications or sleeping medications (Benzodaizepines)   Do not take more than prescribed Pain, Sleep and Anxiety Medications. It is not advisable to combine anxiety,sleep and pain medications without talking with your primary care practitioner   Special Instructions: If you have smoked or chewed Tobacco  in the last 2 yrs please stop smoking, stop any regular Alcohol  and or any Recreational drug use.   Wear Seat belts while driving.   Please note: You were cared for by a hospitalist during your hospital stay. Once you are discharged, your primary care physician will handle any further medical issues. Please note that NO REFILLS for any discharge medications will be authorized once you are discharged, as it is imperative that you return to your primary care physician (or establish a relationship with a primary care physician if you do not have one) for your post hospital discharge needs so that they can reassess your need for medications and monitor your lab values.  Time coordinating discharge: 40 minutes  SIGNED:  Pamella Pert, MD, PhD 03/10/2020, 3:29 PM

## 2020-03-10 NOTE — Progress Notes (Signed)
Physical Therapy Treatment Patient Details Name: Ruth Reynolds MRN: [id number] DOB: 03/15/1950 Today's Date: 03/10/2020    History of Present Illness Pt is a 70 y/o female admitted after being on floor for a day, pt states she fell then slept there and refused to get up. Found to be COVID + with encephalopathy. PMH includes DM, anemia, CHF, and SDH.  Pt found to have acute encepholopathy and acute/subacute stroke in the right paramedial pons.  Multiple remote lacunar infarcts.    PT Comments    Pt agreeable to hallway ambulation and OOB to chair.  She reports significant fatigue (not sleeping well).  She did not feel like walking or getting up and PT reinforced that the bed is making her weaker and more tired.  She was glad she worked with PT at the end of the session.  Positioned in chair on alarm pad.  Remains appropriate for SNF at discharge.  PT will continue to follow acutely for safe mobility progression.   Follow Up Recommendations  SNF     Equipment Recommendations  Rolling walker with 5" wheels    Recommendations for Other Services       Precautions / Restrictions Precautions Precautions: Fall    Mobility  Bed Mobility Overal bed mobility: Modified Independent                Transfers Overall transfer level: Needs assistance Equipment used: Rolling walker (2 wheeled) Transfers: Sit to/from Stand Sit to Stand: Min assist         General transfer comment: min assist to power up to standing with RW  Ambulation/Gait Ambulation/Gait assistance: Min assist Gait Distance (Feet): 20 Feet (x1, 75'x2 (seated rest between)) Assistive device: Rolling walker (2 wheeled) Gait Pattern/deviations: Step-through pattern;Shuffle;Trunk flexed Gait velocity: decreased Gait velocity interpretation: <1.8 ft/sec, indicate of risk for recurrent falls General Gait Details: cues for closer proximity to [employer] Mobility    Modified Rankin  (Stroke Patients Only) Modified Rankin (Stroke Patients Only) Pre-Morbid Rankin Score: Moderate disability Modified Rankin: Moderately severe disability     Balance Overall balance assessment: Needs assistance Sitting-balance support: Feet supported;No upper extremity supported Sitting balance-Leahy Scale: Good     Standing balance support: Bilateral upper extremity supported;Single extremity supported Standing balance-Leahy Scale: Poor Standing balance comment: support from PT and RW in standing                            Cognition Arousal/Alertness: Awake/alert Behavior During Therapy: WFL for tasks assessed/performed Overall Cognitive Status: Impaired/Different from baseline Area of Impairment: Attention;Following commands;Problem solving                   Current Attention Level: Sustained   Following Commands: Follows one step commands with increased time   Awareness: Emergent Problem Solving: Slow processing;Decreased initiation;Difficulty sequencing;Requires verbal cues;Requires tactile cues General Comments: slow processing, easliy distracted requiring re direction frequently.      Exercises General Exercises - Upper Extremity Shoulder Flexion: AROM;Both;10 reps Elbow Flexion: AROM;Both;10 reps General Exercises - Lower Extremity Ankle Circles/Pumps: AROM;Both;10 reps Long Arc Quad: AROM;Both;10 reps Hip Flexion/Marching: AROM;Both;10 reps;Seated    General Comments        Pertinent Vitals/Pain Pain Assessment: No/denies pain Faces Pain Scale: No hurt    Home Living  Prior Function            PT Goals (current goals can now be found in the care plan section) Acute Rehab PT Goals Patient Stated Goal: none stated Progress towards PT goals: Progressing toward goals    Frequency    Min 3X/week      PT Plan Frequency needs to be updated (due to stroke dx)    Co-evaluation               AM-PAC PT "6 Clicks" Mobility   Outcome Measure  Help needed turning from your back to your side while in a flat bed without using bedrails?: None Help needed moving from lying on your back to sitting on the side of a flat bed without using bedrails?: None Help needed moving to and from a bed to a chair (including a wheelchair)?: A Little Help needed standing up from a chair using your arms (e.g., wheelchair or bedside chair)?: A Little Help needed to walk in hospital room?: A Little Help needed climbing 3-5 steps with a railing? : A Lot 6 Click Score: 19    End of Session Equipment Utilized During Treatment: Gait belt Activity Tolerance: Patient limited by fatigue Patient left: in chair;with call bell/phone within reach;with chair alarm set   PT Visit Diagnosis: Unsteadiness on feet (R26.81);Muscle weakness (generalized) (M62.81);History of falling (Z91.81)     Time: 7619-5093 PT Time Calculation (min) (ACUTE ONLY): 34 min  Charges:  $Gait Training: 8-22 mins $Therapeutic Exercise: 8-22 mins                     Corinna Capra, PT, DPT  Acute Rehabilitation [phone] pager [phone]) [phone] office

## 2020-03-24 ENCOUNTER — Ambulatory Visit: Payer: Medicare (Managed Care) | Admitting: Cardiology

## 2020-03-24 ENCOUNTER — Inpatient Hospital Stay: Payer: Medicare Other | Admitting: Family Medicine

## 2020-04-06 ENCOUNTER — Ambulatory Visit: Payer: Medicare (Managed Care) | Admitting: Cardiology

## 2021-08-22 IMAGING — CT CT HEAD W/O CM
4 series · 16 of 47 positions shown, 18 images · non-contrast
Comparison: None.

CLINICAL DATA: Neck trauma.

EXAM:
CT HEAD WITHOUT CONTRAST
CT CERVICAL SPINE WITHOUT CONTRAST
TECHNIQUE: Multidetector CT imaging of the head and cervical spine was
performed following the standard protocol without intravenous
contrast. Multiplanar CT image reconstructions of the cervical spine
were also generated.

[Series 3: head bone · axial · 0.47mm/px · z∈[-182,-146]mm · 3 of 89 slices shown]
[im 9/89  bone]
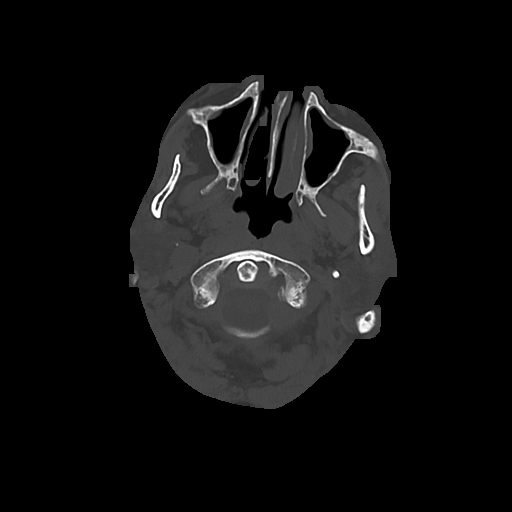
[im 18/89  bone]
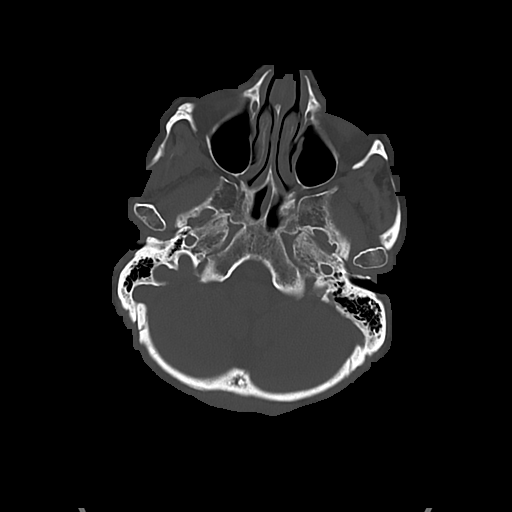
[im 27/89  bone]
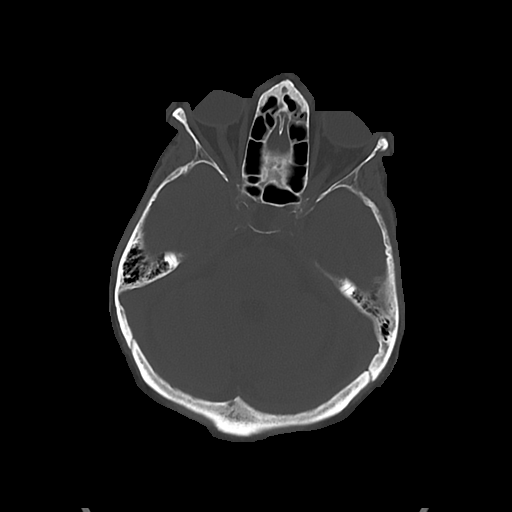

[Series 4: head wo · axial · 0.47mm/px · z∈[-178,-48]mm · 7 of 36 slices shown, 9 images]
[im 5/36  brain]
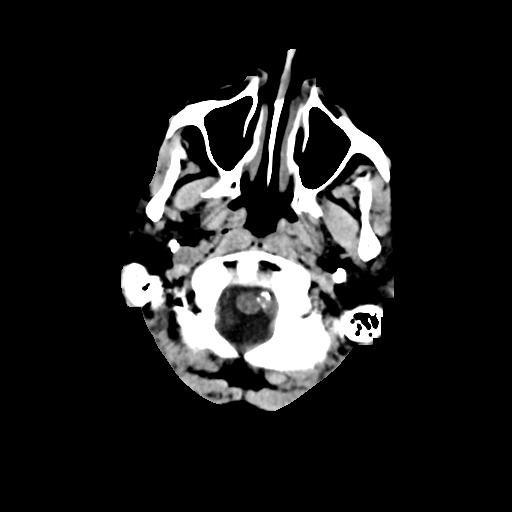
[im 5/36  bone]
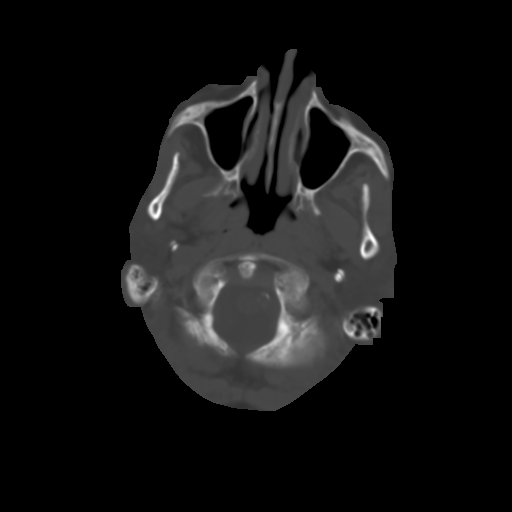
[im 9/36  brain]
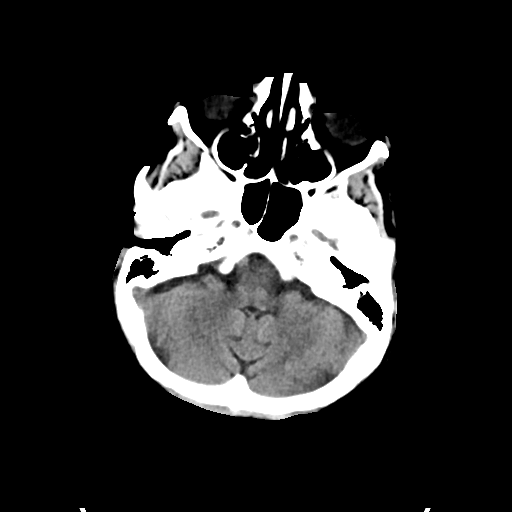
[im 14/36  brain]
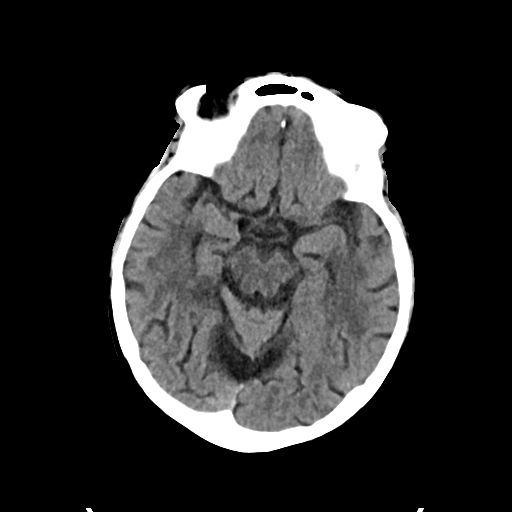
[im 18/36  brain]
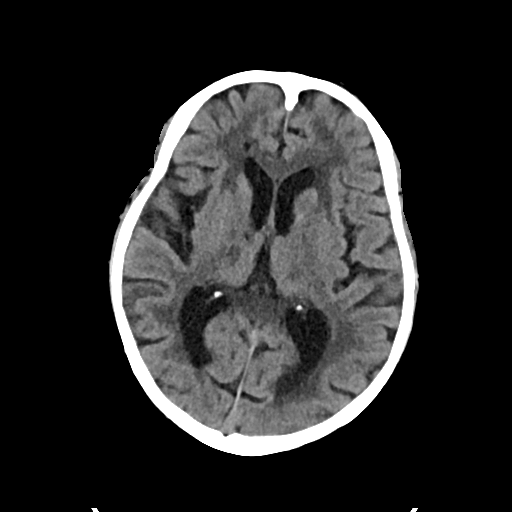
[im 22/36  brain]
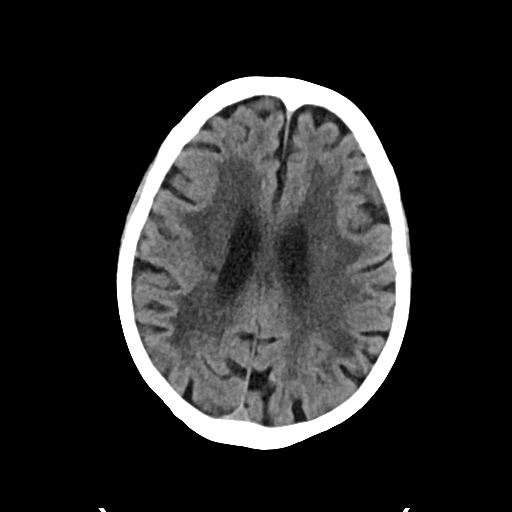
[im 22/36  bone]
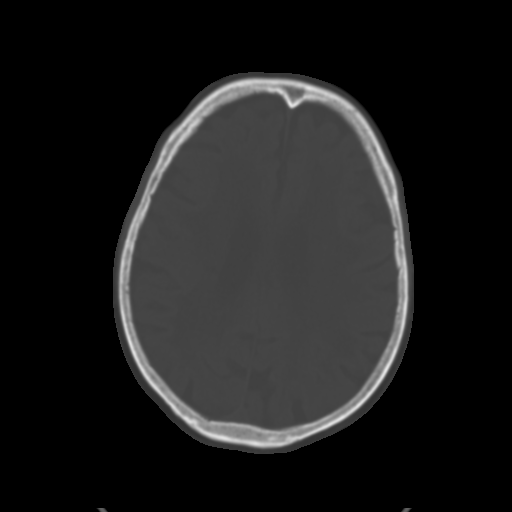
[im 27/36  brain]
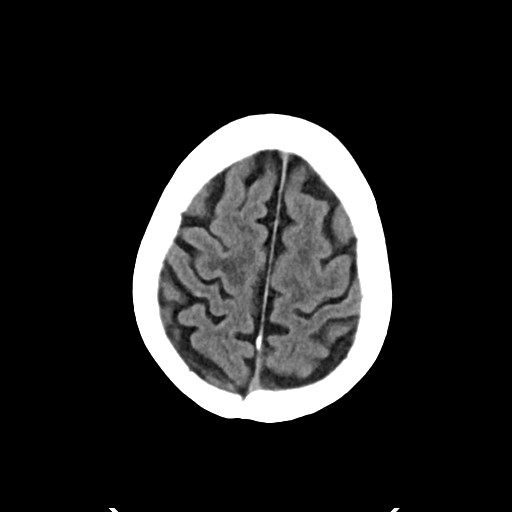
[im 31/36  brain]
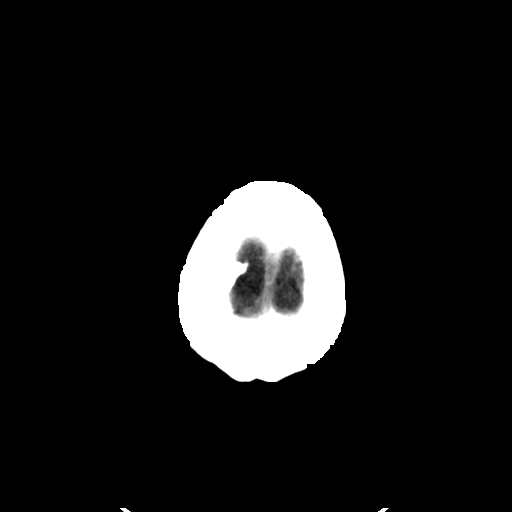

[Series 5: cor soft · coronal · 0.38mm/px · 3 of 75 slices shown]
[im 25/75  brain]
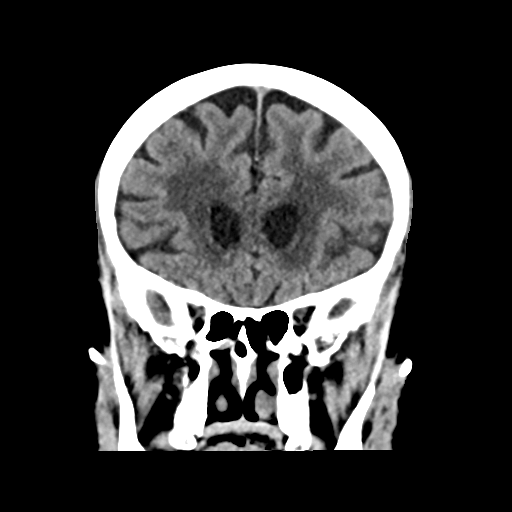
[im 33/75  brain]
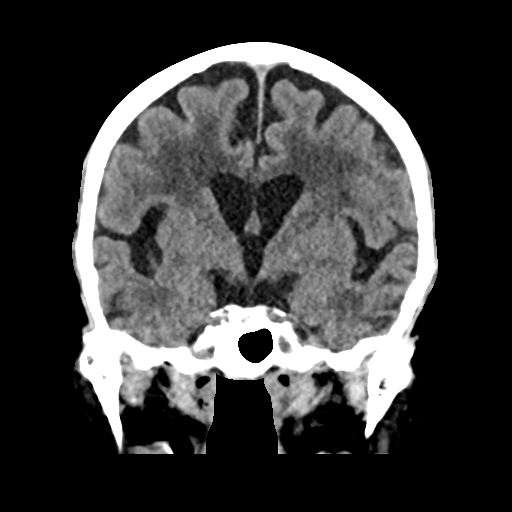
[im 42/75  brain]
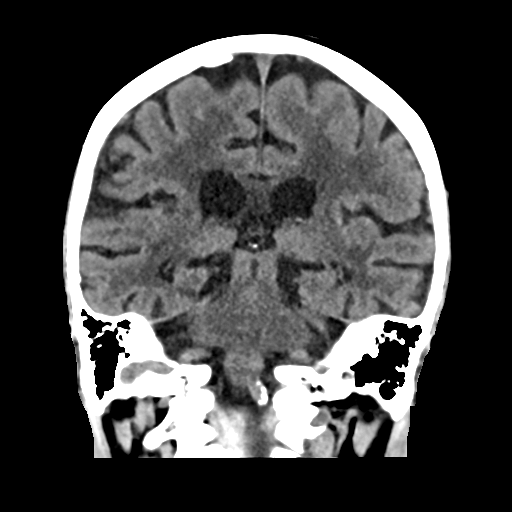

[Series 6: sag soft · sagittal · 0.37mm/px · 3 of 54 slices shown]
[im 18/54  brain]
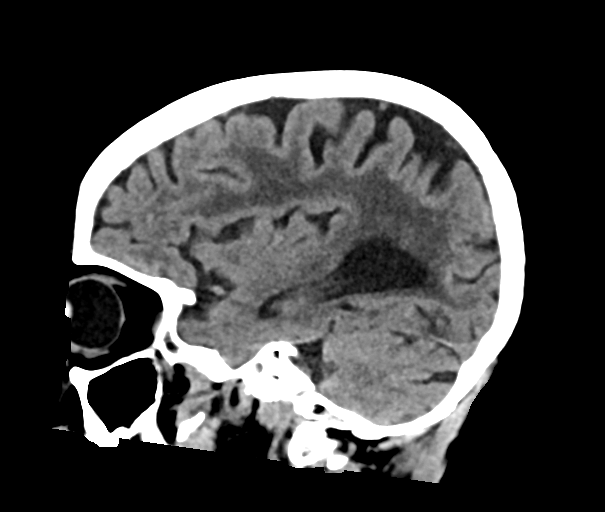
[im 27/54  brain]
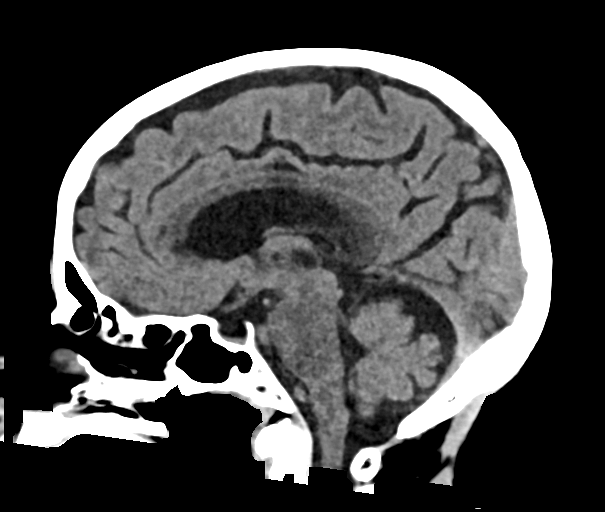
[im 36/54  brain]
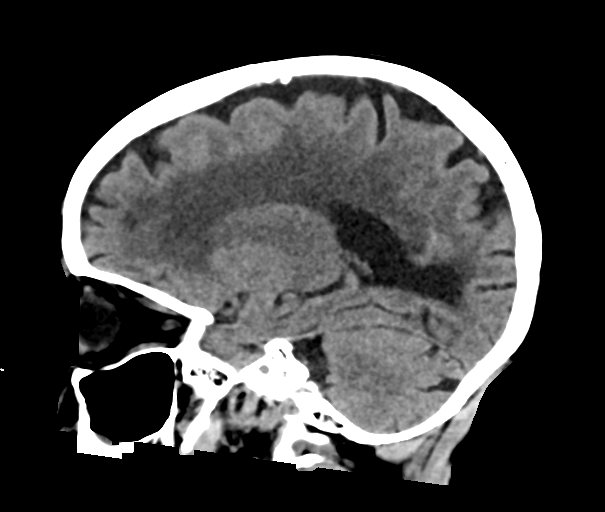

[16 of 47 positions shown; findings below may reference images not displayed]

FINDINGS: CT HEAD FINDINGS

Brain: No evidence of acute infarction, hemorrhage, hydrocephalus,
extra-axial collection or mass lesion/mass effect. There is chronic
diffuse atrophy. Chronic bilateral periventricular white matter
small vessel ischemic changes noted.

Vascular: No hyperdense vessel is noted.

Skull: Normal. Negative for fracture or focal lesion.

Sinuses/Orbits: No acute finding.

Other: None.

CT CERVICAL SPINE FINDINGS

Alignment: Normal.

Skull base and vertebrae: No acute fracture. No primary bone lesion
or focal pathologic process.

Soft tissues and spinal canal: No prevertebral fluid or swelling. No
visible canal hematoma.

Disc levels: Degenerative joint changes with narrowed joint space
and osteophyte formation identified mid to lower cervical spine.

Upper chest: Negative.

Other: None.
IMPRESSION: 1. No focal acute intracranial abnormality identified.
2. Chronic diffuse atrophy and chronic bilateral periventricular
white matter small vessel ischemic change.
3. No acute fracture or dislocation of cervical spine.
4. Degenerative joint changes of cervical spine.

## 2021-08-23 IMAGING — DX DG CHEST 1V PORT
1 series · 1 of 1 positions shown · non-contrast
Comparison: None.

CLINICAL DATA: [id number] positive.  Weakness.

EXAM:
PORTABLE CHEST 1 VIEW

[chest]
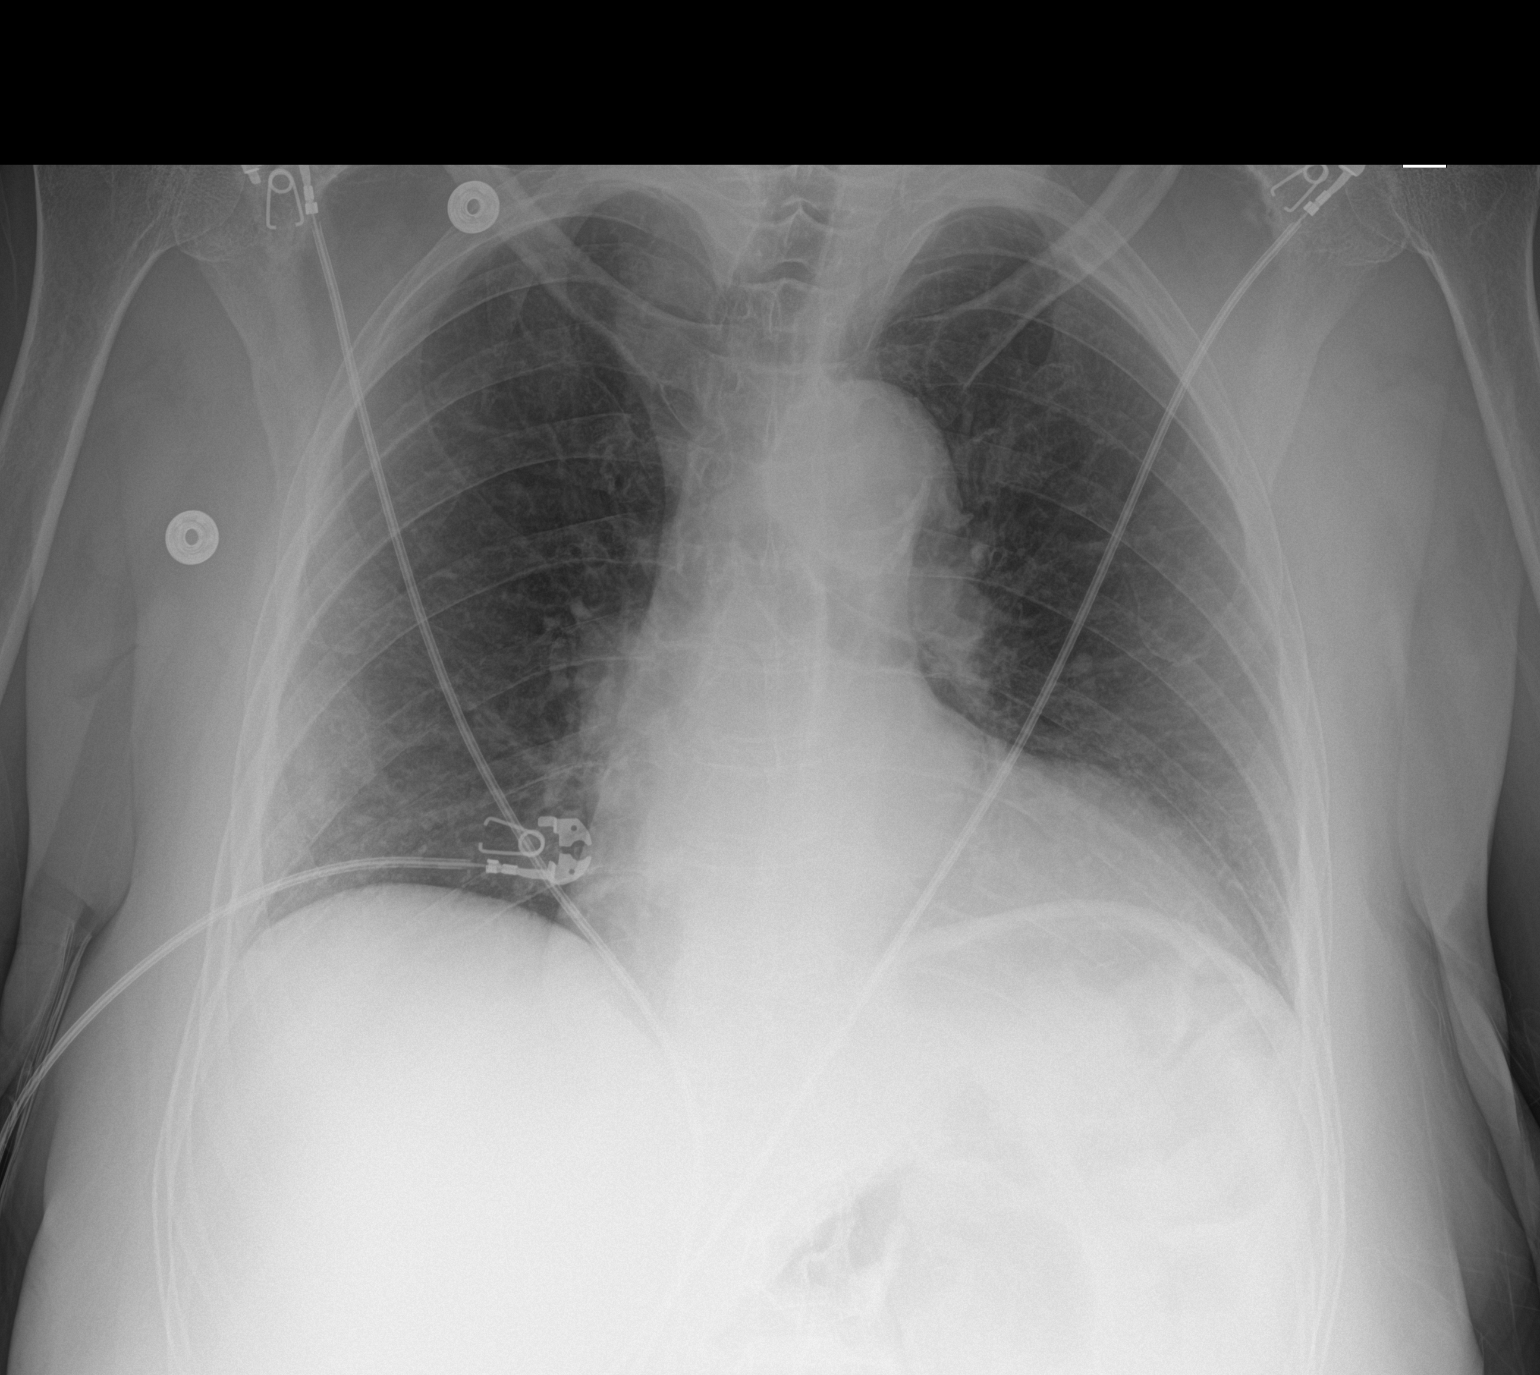

[1 of 1 positions shown; findings below may reference images not displayed]

FINDINGS: There is a subtle area of airspace opacity in the lateral right base
region. Lungs elsewhere clear. Heart is borderline enlarged with
pulmonary vascularity normal. No adenopathy. There is aortic
atherosclerosis. No bone lesions.
IMPRESSION: Subtle airspace opacity lateral right base concerning for focus of
developing pneumonia. Atypical organism pneumonia could present in
this manner. Lungs otherwise clear. Heart borderline enlarged.

Aortic Atherosclerosis ([id number]).
